# Patient Record
Sex: Female | Born: 1965 | Race: White | Hispanic: No | Smoking: Current every day smoker
Health system: Southern US, Community
[De-identification: ages and names within clinical notes are randomized; demographics above are authoritative.]

## PROBLEM LIST (undated history)

## (undated) DIAGNOSIS — R011 Cardiac murmur, unspecified: Secondary | ICD-10-CM

## (undated) DIAGNOSIS — T7840XA Allergy, unspecified, initial encounter: Secondary | ICD-10-CM

## (undated) HISTORY — PX: AUGMENTATION MAMMAPLASTY: SUR837

## (undated) HISTORY — PX: ABDOMINAL HYSTERECTOMY: SHX81

## (undated) HISTORY — DX: Cardiac murmur, unspecified: R01.1

## (undated) HISTORY — DX: Allergy, unspecified, initial encounter: T78.40XA

---

## 2013-10-19 ENCOUNTER — Ambulatory Visit (INDEPENDENT_AMBULATORY_CARE_PROVIDER_SITE_OTHER): Payer: BC Managed Care – PPO | Admitting: Internal Medicine

## 2013-10-19 VITALS — BP 110/68 | HR 97 | Temp 97.5°F | Resp 16 | Ht 69.0 in | Wt <= 1120 oz

## 2013-10-19 DIAGNOSIS — R300 Dysuria: Secondary | ICD-10-CM

## 2013-10-19 DIAGNOSIS — R350 Frequency of micturition: Secondary | ICD-10-CM

## 2013-10-19 DIAGNOSIS — N390 Urinary tract infection, site not specified: Secondary | ICD-10-CM

## 2013-10-19 LAB — POCT URINALYSIS DIPSTICK
Bilirubin, UA: NEGATIVE
Glucose, UA: NEGATIVE
Ketones, UA: 15
Nitrite, UA: NEGATIVE
PH UA: 5.5
PROTEIN UA: 100
Urobilinogen, UA: 0.2

## 2013-10-19 LAB — POCT UA - MICROSCOPIC ONLY
CRYSTALS, UR, HPF, POC: NEGATIVE
Casts, Ur, LPF, POC: NEGATIVE
Mucus, UA: NEGATIVE
Yeast, UA: NEGATIVE

## 2013-10-19 MED ORDER — PHENAZOPYRIDINE HCL 100 MG PO TABS: 200.0000 mg | ORAL_TABLET | Freq: Three times a day (TID) | ORAL | Status: DC

## 2013-10-19 MED ORDER — PHENAZOPYRIDINE HCL 200 MG PO TABS: 200.0000 mg | ORAL_TABLET | Freq: Three times a day (TID) | ORAL | Status: DC | PRN

## 2013-10-19 MED ORDER — SULFAMETHOXAZOLE-TMP DS 800-160 MG PO TABS: 1.0000 | ORAL_TABLET | Freq: Two times a day (BID) | ORAL | Status: DC

## 2013-10-19 MED ORDER — PHENAZOPYRIDINE HCL 100 MG PO TABS
200.0000 mg | ORAL_TABLET | Freq: Once | ORAL | Status: AC
  Administered 2013-10-19: 200 mg via ORAL

## 2013-10-19 NOTE — Progress Notes (Signed)
Subjective:    Patient ID: Latoya Martinez, female    DOB: 1965-06-25, 48 y.o.   MRN: [id number] This chart was scribed for Ellamae Sia, MD by [place] Peace, ED Scribe. The patient was seen in [facility]. The patient's care was started at 2:23 PM.   HPI HPI Comments: Latoya Martinez is a 48 y.o. female who presents to the [facility] complaining of bladder infection onset yesterday with associated burning upon urination, increased frequency, lower back pain and lower abdominal pain. Pt states she usually gets infections whenever she has to make long drives to other states for work. She further states she just finished a 9 hr drive back from [place]. She states that her PCP is in [place] which is where she lives but she couldn't wait to make it there to get checked so she stopped here in [place]. She reports 1 previous occurrence this past year. Pt also reports that her PCP normally gives her Septra to address bladder infection which she states always provides her with relief. She denies any history of kidney stones.   Review of Systems  Constitutional: Negative for fever and chills.  Gastrointestinal: Positive for abdominal pain.  Genitourinary: Positive for dysuria and frequency.  Musculoskeletal: Positive for back pain.       Objective:   Physical Exam  Nursing note and vitals reviewed. Constitutional: She is oriented to person, place, and time. She appears well-developed and well-nourished. No distress.  HENT:  Head: Normocephalic and atraumatic.  Eyes: Conjunctivae and EOM are normal.  Neck: Neck supple. No tracheal deviation present.  Cardiovascular: Normal rate.   Pulmonary/Chest: Effort normal. No respiratory distress.  Abdominal: There is no tenderness. There is no guarding.  No flank tenderness to percussion  Musculoskeletal: Normal range of motion.  Neurological: She is alert and oriented to person, place, and time.  Skin: Skin is warm and dry.  Psychiatric: She has a  normal mood and affect. Her behavior is normal.   Results for orders placed in visit on 10/19/13  POCT UA - MICROSCOPIC ONLY      Result Value Ref Range   WBC, Ur, HPF, POC 10-12     RBC, urine, microscopic TNTC     Bacteria, U Microscopic 2+     Mucus, UA neg     Epithelial cells, urine per micros 3-5     Crystals, Ur, HPF, POC neg     Casts, Ur, LPF, POC neg     Yeast, UA neg    POCT URINALYSIS DIPSTICK      Result Value Ref Range   Color, UA dark yellow     Clarity, UA cloudy     Glucose, UA neg     Bilirubin, UA neg     Ketones, UA 15     Spec Grav, UA >=1.030     Blood, UA large     pH, UA 5.5     Protein, UA 100     Urobilinogen, UA 0.2     Nitrite, UA neg     Leukocytes, UA small (1+)            Assessment & Plan:  Burning with urination - Plan: POCT UA - Microscopic Only, POCT urinalysis dipstick, phenazopyridine (PYRIDIUM) tablet 200 mg, DISCONTINUED: phenazopyridine (PYRIDIUM) tablet 200 mg  Urination frequency - Plan: POCT UA - Microscopic Only, POCT urinalysis dipstick, phenazopyridine (PYRIDIUM) tablet 200 mg, DISCONTINUED: phenazopyridine (PYRIDIUM) tablet 200 mg  Urinary tract infection, site not specified - Plan: phenazopyridine (PYRIDIUM)  tablet 200 mg, DISCONTINUED: phenazopyridine (PYRIDIUM) tablet 200 mg  Meds ordered this encounter  Medications  . sulfamethoxazole-trimethoprim (BACTRIM DS) 800-160 MG per tablet    Sig: Take 1 tablet by mouth 2 (two) times daily.    Dispense:  10 tablet    Refill:  0  . phenazopyridine (PYRIDIUM) 200 MG tablet    Sig: Take 1 tablet (200 mg total) by mouth 3 (three) times daily as needed for pain.    Dispense:  10 tablet    Refill:  0  . DISCONTD: phenazopyridine (PYRIDIUM) tablet 200 mg    Sig:   . phenazopyridine (PYRIDIUM) tablet 200 mg    Sig:     I have completed the patient encounter in its entirety as documented by the scribe, with editing by me where necessary. Rhen Kawecki P. Merla Riches, M.D.

## 2017-03-28 DIAGNOSIS — B9689 Other specified bacterial agents as the cause of diseases classified elsewhere: Secondary | ICD-10-CM | POA: Diagnosis not present

## 2017-03-28 DIAGNOSIS — R5383 Other fatigue: Secondary | ICD-10-CM | POA: Diagnosis not present

## 2017-03-28 DIAGNOSIS — J019 Acute sinusitis, unspecified: Secondary | ICD-10-CM | POA: Diagnosis not present

## 2017-05-29 DIAGNOSIS — J181 Lobar pneumonia, unspecified organism: Secondary | ICD-10-CM | POA: Diagnosis not present

## 2017-06-15 ENCOUNTER — Ambulatory Visit (INDEPENDENT_AMBULATORY_CARE_PROVIDER_SITE_OTHER): Payer: BLUE CROSS/BLUE SHIELD | Admitting: Family Medicine

## 2017-06-15 ENCOUNTER — Encounter: Payer: Self-pay | Admitting: Family Medicine

## 2017-06-15 VITALS — BP 108/82 | HR 82 | Temp 97.8°F | Wt 137.0 lb

## 2017-06-15 DIAGNOSIS — F909 Attention-deficit hyperactivity disorder, unspecified type: Secondary | ICD-10-CM | POA: Diagnosis not present

## 2017-06-15 DIAGNOSIS — J3089 Other allergic rhinitis: Secondary | ICD-10-CM

## 2017-06-15 DIAGNOSIS — Z7689 Persons encountering health services in other specified circumstances: Secondary | ICD-10-CM | POA: Diagnosis not present

## 2017-06-15 NOTE — Progress Notes (Signed)
Patient presents to clinic today to establish care.  SUBJECTIVE: PMH: Patient is a 52 year old female with past medical history significant for seasonal allergies, COPD, and heart murmur.  Patient was previously seen by Dr. Eustace Quail at [facility] in [place].  Patient states she would like to keep her doctor back at home however finds it inconvenient to have to drive the 3 to 4 hours towards [place] if she needs something.  Patient states [facility]   Seasonal and environmental allergies: -Patient endorses ongoing history of severe allergies -In the past patient has done allergy testing and states she is allergic to multiple things. -Patient states over-the-counter allergy medicine does not help her.  She is currently taking Alavert -Patient inquires about allergy shots. -states in the past was told not to do saline rinse.  ADHD: -Patient has been taking Adderall 10 mg twice daily for 7 years. -Patient states her doctor at home gave her another's month prescription but she will need refills after that. -Patient inquires if this provider can take over that prescription for her so she does not have to drive to [place].  Allergies: NKDA -Patient does endorse sensitivity to steroids.  Patient states they make her anxious/shaky  Past surgical history: Tonsillectomy-1985 Hysterectomy 2000  Social history: Patient is divorced.  She is currently employed as a Immunologist.  Prior to this patient worked for [employer].  Patient has a 2 year old son.  Patient endorses social alcohol use.  Patient endorses occasional tobacco use.  She states she is quit off and on over the years.  Patient is currently smoking 2 cigarettes/day she was recently diagnosed with pneumonia so she stopped smoking during that period of time.  Patient denies drug use.  Family medical history: Mom-alive, arthritis, colon  cancer, hearing loss, stroke Dad-deceased, brain tumor Sister-mildly, alive Sister-Lori, alive Sister-Tula, alive, depression Brother-David, alive, asthma   Health Maintenance: Immunizations --influenza vaccine 2005, TB skin test 2000 Colonoscopy --2007 Mammogram --2007 PAP --2017   No past medical history on file.   Current Outpatient Medications on File Prior to Visit  Medication Sig Dispense Refill  . phenazopyridine (PYRIDIUM) 200 MG tablet Take 1 tablet (200 mg total) by mouth 3 (three) times daily as needed for pain. 10 tablet 0  . sulfamethoxazole-trimethoprim (BACTRIM DS) 800-160 MG per tablet Take 1 tablet by mouth 2 (two) times daily. 10 tablet 0   No current facility-administered medications on file prior to visit.     No Known Allergies  No family history on file.  Social History   Socioeconomic History  . Marital status: Divorced    Spouse name: Not on file  . Number of children: Not on file  . Years of education: Not on file  . Highest education level: Not on file  Occupational History  . Not on file  Social Needs  . Financial resource strain: Not on file  . Food insecurity:    Worry: Not on file    Inability: Not on file  . Transportation needs:    Medical: Not on file    Non-medical: Not on file  Tobacco Use  . Smoking status: Current Every Day Smoker  Substance and Sexual Activity  . Alcohol use: No  . Drug use: No  . Sexual activity: Not on file  Lifestyle  . Physical activity:    Days per week: Not on file    Minutes per session: Not  on file  . Stress: Not on file  Relationships  . Social connections:    Talks on phone: Not on file    Gets together: Not on file    Attends religious service: Not on file    Active member of club or organization: Not on file    Attends meetings of clubs or organizations: Not on file    Relationship status: Not on file  . Intimate partner violence:    Fear of current or ex partner: Not on file     Emotionally abused: Not on file    Physically abused: Not on file    Forced sexual activity: Not on file  Other Topics Concern  . Not on file  Social History Narrative  . Not on file    ROS General: Denies fever, chills, night sweats, changes in weight, changes in appetite  +severe allergies HEENT: Denies headaches, ear pain, changes in vision, rhinorrhea, sore throat CV: Denies CP, palpitations, SOB, orthopnea Pulm: Denies SOB, cough, wheezing GI: Denies abdominal pain, nausea, vomiting, diarrhea, constipation GU: Denies dysuria, hematuria, frequency, vaginal discharge Msk: Denies muscle cramps, joint pains Neuro: Denies weakness, numbness, tingling Skin: Denies rashes, bruising Psych: Denies depression, anxiety, hallucinations  BP 108/82   Pulse 82   Temp 97.8 F (36.6 C)   Wt 137 lb (62.1 kg)   SpO2 96%   BMI 20.23 kg/m   Physical Exam Gen. Pleasant, well developed, well-nourished, in NAD HEENT - [place]/AT, PERRL, no scleral icterus, no nasal drainage, pharynx without erythema or exudate.  TMs full bilaterally.  No cervical lypmhadenopathy Lungs: no use of accessory muscles, CTAB, no wheezes, rales or rhonchi Cardiovascular: RRR, No r/g/m, no peripheral edema Abdomen: BS present, soft, nontender, nondistended Neuro:  A&Ox3, CN II-XII intact, normal gait Skin:  Warm, dry, intact, no lesions Psych: normal affect, mood appropriate  No results found for this or any previous visit (from the past 2160 hour(s)).  Assessment/Plan: Environmental and seasonal allergies -continue alavert  - Plan: Ambulatory referral to Allergy  Attention deficit hyperactivity disorder (ADHD), unspecified ADHD type -continue Adderall 10 mg BID  Encounter to establish care -We reviewed the PMH, PSH, FH, SH, Meds and Allergies. -We provided refills for any medications we will prescribe as needed. -We addressed current concerns per orders and patient instructions. -We have asked for records for  pertinent exams, studies, vaccines and notes from previous providers. -We have advised patient to follow up per instructions below.  F/u prn  Abbe Amsterdam, MD

## 2017-06-15 NOTE — Patient Instructions (Signed)

## 2017-07-17 ENCOUNTER — Other Ambulatory Visit: Payer: Self-pay | Admitting: Family Medicine

## 2017-07-17 NOTE — Telephone Encounter (Signed)
Copied from CRM [id number]. Topic: Quick Communication - Rx Refill/Question >> Jul 17, 2017 10:23 AM Raquel SarnaHayes, Latoya G wrote: amphetamine-dextroamphetamine (ADDERALL) 10 MG tablet  Needing refill  [phone] [name]- [place], [place] - [address] [place] [id number] Phone: [phone] Fax: [phone]

## 2017-07-18 NOTE — Telephone Encounter (Signed)
Pt request refill, please Advice.

## 2017-07-18 NOTE — Telephone Encounter (Signed)
Request for Adderall refill; last refill per historical provider. Last office visit: 06/15/17 PCP: Abbe AmsterdamShannon Banks [facility]acy[facility] [address] [place]

## 2017-07-19 MED ORDER — AMPHETAMINE-DEXTROAMPHETAMINE 10 MG PO TABS: 10.0000 mg | ORAL_TABLET | Freq: Two times a day (BID) | ORAL | 0 refills | Status: DC

## 2017-07-20 ENCOUNTER — Ambulatory Visit: Payer: Self-pay | Admitting: Allergy

## 2017-07-24 ENCOUNTER — Ambulatory Visit (INDEPENDENT_AMBULATORY_CARE_PROVIDER_SITE_OTHER)
Admission: RE | Admit: 2017-07-24 | Discharge: 2017-07-24 | Disposition: A | Discharge status: Home / Self Care | Payer: BLUE CROSS/BLUE SHIELD | Source: Ambulatory Visit | Attending: Family Medicine | Admitting: Family Medicine

## 2017-07-24 ENCOUNTER — Ambulatory Visit (INDEPENDENT_AMBULATORY_CARE_PROVIDER_SITE_OTHER): Payer: BLUE CROSS/BLUE SHIELD | Admitting: Family Medicine

## 2017-07-24 ENCOUNTER — Encounter: Payer: Self-pay | Admitting: Family Medicine

## 2017-07-24 VITALS — BP 124/86 | Temp 98.5°F | Wt 138.0 lb

## 2017-07-24 DIAGNOSIS — M79672 Pain in left foot: Secondary | ICD-10-CM

## 2017-07-24 DIAGNOSIS — M19072 Primary osteoarthritis, left ankle and foot: Secondary | ICD-10-CM | POA: Diagnosis not present

## 2017-07-24 NOTE — Patient Instructions (Signed)
Foot Pain Many things can cause foot pain. Some common causes are:  An injury.  A sprain.  Arthritis.  Blisters.  Bunions.  Follow these instructions at home: Pay attention to any changes in your symptoms. Take these actions to help with your discomfort:  If directed, put ice on the affected area: ? Put ice in a plastic bag. ? Place a towel between your skin and the bag. ? Leave the ice on for 15-20 minutes, 3?4 times a day for 2 days.  Take over-the-counter and prescription medicines only as told by your health care provider.  Wear comfortable, supportive shoes that fit you well. Do not wear high heels.  Do not stand or walk for long periods of time.  Do not lift a lot of weight. This can put added pressure on your feet.  Do stretches to relieve foot pain and stiffness as told by your health care provider.  Rub your foot gently.  Keep your feet clean and dry.  Contact a health care provider if:  Your pain does not get better after a few days of self-care.  Your pain gets worse.  You cannot stand on your foot. Get help right away if:  Your foot is numb or tingling.  Your foot or toes are swollen.  Your foot or toes turn white or blue.  You have warmth and redness along your foot. This information is not intended to replace advice given to you by your health care provider. Make sure you discuss any questions you have with your health care provider. Document Released: 02/27/2015 Document Revised: 07/09/2015 Document Reviewed: 02/26/2014 Elsevier Interactive Patient Education  2018 Elsevier Inc.  

## 2017-07-24 NOTE — Progress Notes (Signed)
Subjective:    Patient ID: Jonathon BellowsJennifer Ashley Nyborg, female    DOB: 06/03/1965, 52 y.o.   MRN: [id number]  No chief complaint on file.   HPI Patient was seen today for acute concern.  Pt endorses left foot pain x10 days.  Pt states she has been walking 3-4 miles several times per week when she noticed the discomfort.  Has been wearing nike tennis shoes.  Initially, pain on dorsum of her left foot but has now moved to the plantar surface with mild edema.  Pt tried ice for her symptoms.  She tried to see the podiatrist but the appt was 1 wk out.  Pt has a h/o flat feet.  Denies h/o gout or DM.  Past Medical History:  Diagnosis Date  . Allergy   . Heart murmur     No Known Allergies  ROS General: Denies fever, chills, night sweats, changes in weight, changes in appetite HEENT: Denies headaches, ear pain, changes in vision, rhinorrhea, sore throat CV: Denies CP, palpitations, SOB, orthopnea Pulm: Denies SOB, cough, wheezing GI: Denies abdominal pain, nausea, vomiting, diarrhea, constipation GU: Denies dysuria, hematuria, frequency, vaginal discharge Msk: Denies muscle cramps, joint pains  +L foot pain Neuro: Denies weakness, numbness, tingling Skin: Denies rashes, bruising Psych: Denies depression, anxiety, hallucinations     Objective:    Blood pressure 124/86, temperature 98.5 F (36.9 C), temperature source Oral, weight 138 lb (62.6 kg).   Gen. Pleasant, well-nourished, in no distress, normal affect   HEENT: [place]/AT, face symmetric, no scleral icterus, PERRLA, nares patent without drainage. Lungs: no accessory muscle use, CTAB, no wheezes or rales Cardiovascular: RRR, no m/r/g, no peripheral edema MSK:  No TTP of b/l feet, DP and PT pulses 2+ b/l. Mild edema of plantar surface of L foot. Normal strength, inversion, eversion, dorsiflexion, and plantar flexion of b/l feet. Neuro:  A&Ox3, CN II-XII intact, normal gait Skin:  Warm, no lesions/ rash   Wt Readings from Last 3  Encounters:  07/24/17 138 lb (62.6 kg)  06/15/17 137 lb (62.1 kg)  10/19/13 14 lb 9.6 oz (6.623 kg)    No results found for: WBC, HGB, HCT, PLT, GLUCOSE, CHOL, TRIG, HDL, LDLDIRECT, LDLCALC, ALT, AST, NA, K, CL, CREATININE, BUN, CO2, TSH, PSA, INR, GLUF, HGBA1C, MICROALBUR  Assessment/Plan:  Left foot pain  -sprain vs stress fx -discussed supportive care, ice, tylenol, elevation - Plan: DG Foot Complete Left -will notify pt of results. May benefit from walking shoe.  F/u prn  Abbe AmsterdamShannon Banks, MD

## 2017-07-25 ENCOUNTER — Telehealth: Payer: Self-pay | Admitting: Family Medicine

## 2017-07-25 NOTE — Telephone Encounter (Signed)
X-ray result were given to pt, verbalized understanding

## 2017-07-25 NOTE — Telephone Encounter (Signed)
Copied from CRM [id number]. Topic: Inquiry >> Jul 25, 2017 10:52 AM Lorrine KinMcGee, Lalena Salas B, NT wrote: Reason for CRM: Patient calling to inquire about her x-ray results. States that it has been a day and she has not heard anything. Is very upset. Explained to her that the results have not been release by Dr Salomon FickBanks yet and that when she had a chance to look over the results, she would release them and have her assistant would give her a call. States that she needs to know today.  CB#: [phone]

## 2017-07-25 NOTE — Telephone Encounter (Signed)
X-Ray results are awaiting review from Dr Salomon FickBanks

## 2017-07-25 NOTE — Telephone Encounter (Signed)
Copied from CRM [id number]. Topic: Inquiry >> Jul 25, 2017  3:02 PM Yvonna Alanisobinson, Andra M wrote: Reason for CRM: Patient called very upset that she has nor received her x-ray results. I advised that patient that the doctor has not had a chance to review the x-ray at this time. The patient states that she must know today. She also stated that she has been waiting for 24 hours. Please call patient today.       Thank You!!!

## 2017-07-26 NOTE — Telephone Encounter (Signed)
Results were given to pt voiced understanding.

## 2017-08-21 ENCOUNTER — Other Ambulatory Visit: Payer: Self-pay | Admitting: Family Medicine

## 2017-08-21 NOTE — Telephone Encounter (Signed)
Copied from CRM [id number]. Topic: Quick Communication - Rx Refill/Question >> Aug 21, 2017 12:29 PM Mcneil, Ja-Kwan wrote: Medication: amphetamine-dextroamphetamine (ADDERALL) 10 MG tablet  Has the patient contacted their pharmacy?  no  Preferred Pharmacy (with phone number or street name): [id number] [place] [place] - [address] [phone] (Phone) [phone] (Fax)    Agent: Please be advised that RX refills may take up to 3 business days. We ask that you follow-up with your pharmacy.

## 2017-08-22 NOTE — Telephone Encounter (Signed)
Rx refill request: Adderall 10 mg     Last filled: 07/19/17  LOV: 06/15/17   PCP: Salomon FickBanks  Pharmacy: verified

## 2017-08-22 NOTE — Telephone Encounter (Signed)
Please Advise

## 2017-08-24 MED ORDER — AMPHETAMINE-DEXTROAMPHETAMINE 10 MG PO TABS: 10.0000 mg | ORAL_TABLET | Freq: Two times a day (BID) | ORAL | 0 refills | Status: DC

## 2017-08-24 NOTE — Telephone Encounter (Signed)
Patient requesting rx to be sent electronically. She does want to have to drive to office to pick up hard copy rx. States last time it was filled electronically. Please advise.

## 2017-08-24 NOTE — Telephone Encounter (Signed)
Called pt left a message for pt to return my call in the office regarding her Rx for Adderall, Rx is ready for pick up at the front office.

## 2017-08-25 NOTE — Telephone Encounter (Signed)
Electronic system is not working when this provider tried to escribe this med this wk.

## 2017-08-28 NOTE — Telephone Encounter (Signed)
Left message notifying patient that hard copy rx will need to be picked up in office.

## 2017-08-30 ENCOUNTER — Ambulatory Visit: Payer: Self-pay | Admitting: Allergy

## 2017-09-15 ENCOUNTER — Encounter: Payer: Self-pay | Admitting: Family Medicine

## 2017-09-15 ENCOUNTER — Ambulatory Visit (INDEPENDENT_AMBULATORY_CARE_PROVIDER_SITE_OTHER): Payer: BLUE CROSS/BLUE SHIELD | Admitting: Family Medicine

## 2017-09-15 VITALS — BP 118/60 | HR 86 | Temp 97.6°F | Wt 136.8 lb

## 2017-09-15 DIAGNOSIS — J029 Acute pharyngitis, unspecified: Secondary | ICD-10-CM

## 2017-09-15 MED ORDER — AMOXICILLIN 500 MG PO CAPS: 1000.0000 mg | ORAL_CAPSULE | Freq: Every day | ORAL | 0 refills | Status: AC

## 2017-09-15 NOTE — Patient Instructions (Signed)
Consider using nasal saline rinses 30-60 minutes prior to bedtime to help decrease post nasal drainage once you lay down. The neti pot is very effective for this.

## 2017-09-15 NOTE — Progress Notes (Signed)
Thamas JaegersJennifer Ashley Silverman DOB: 03/22/1965 Encounter date: 09/15/2017  This is a 52 y.o. female who presents with Chief Complaint  Patient presents with  . Sore Throat    traveling monday, sore throat x 3 days, hurts to swallow, chills last weekend, no energy, patient states she has bad seasonal allergies and has not been taking her allergy medication    History of present illness:  Has had a lot of sick exposures at work. When she woke up this morning felt like knives in right side of throat. Throat felt worse this morning than it did at beginning; seems to switch sides. Worst in morning/night. Not sure if she is getting post nasal drainage. No cough. No fevers, chills. Some fatigue. No sinus pain/pressure. Right ear feels a little different.       No Known Allergies Current Meds  Medication Sig  . amphetamine-dextroamphetamine (ADDERALL) 10 MG tablet Take 1 tablet (10 mg total) by mouth 2 (two) times daily with a meal.  . [DISCONTINUED] phenazopyridine (PYRIDIUM) 200 MG tablet Take 1 tablet (200 mg total) by mouth 3 (three) times daily as needed for pain.    Review of Systems  Constitutional: Positive for fatigue. Negative for chills and fever.  HENT: Positive for sore throat. Negative for congestion, ear pain, hearing loss, sinus pressure and sinus pain.   Respiratory: Negative for cough, chest tightness, shortness of breath and wheezing.   Cardiovascular: Negative for chest pain.  Gastrointestinal: Positive for abdominal pain (last week for 2-3 days had some stomach discomfort; right lower abd. nonspecific. Just decreased appetite slightly.). Negative for constipation, diarrhea, nausea and vomiting.  Skin: Negative for rash.    Objective:  BP 118/60 (BP Location: Left Arm, Patient Position: Sitting, Cuff Size: Normal)   Pulse 86   Temp 97.6 F (36.4 C) (Oral)   Wt 136 lb 12.8 oz (62.1 kg)   SpO2 97%   BMI 20.20 kg/m   Weight: 136 lb 12.8 oz (62.1 kg)   BP Readings from Last  3 Encounters:  09/15/17 118/60  07/24/17 124/86  06/15/17 108/82   Wt Readings from Last 3 Encounters:  09/15/17 136 lb 12.8 oz (62.1 kg)  07/24/17 138 lb (62.6 kg)  06/15/17 137 lb (62.1 kg)    Physical Exam  Constitutional: She appears well-developed and well-nourished. No distress.  HENT:  Right Ear: Tympanic membrane and ear canal normal.  Left Ear: Tympanic membrane and ear canal normal.  Nose: Right sinus exhibits no maxillary sinus tenderness and no frontal sinus tenderness. Left sinus exhibits no maxillary sinus tenderness and no frontal sinus tenderness.  Mouth/Throat: Mucous membranes are normal. No oral lesions. No uvula swelling. Posterior oropharyngeal edema (more towards right) and posterior oropharyngeal erythema present. No oropharyngeal exudate.  Cardiovascular: Normal rate, regular rhythm and normal heart sounds. Exam reveals no friction rub.  No murmur heard. Pulmonary/Chest: Effort normal and breath sounds normal. No respiratory distress. She has no wheezes. She has no rales.  Lymphadenopathy:    She has cervical adenopathy (anterior cervical).  Psychiatric: She has a normal mood and affect.    Assessment/Plan 1. Sore throat Strep positive in the office.  Amoxicillin given to cover for strep.  Let us know if not feeling significantly improved in 48 hours.  Let us know if any worsening.  We did discuss importance of completing antibiotic course. We did discuss contagiousness of strep.  - POC Rapid Strep A  Return if symptoms worsen or fail to improve.  Micheline Rough, MD

## 2017-09-18 LAB — POCT RAPID STREP A (OFFICE): Rapid Strep A Screen: POSITIVE — AB

## 2017-09-22 ENCOUNTER — Other Ambulatory Visit: Payer: Self-pay | Admitting: Family Medicine

## 2017-09-22 ENCOUNTER — Telehealth: Payer: Self-pay | Admitting: Family Medicine

## 2017-09-22 DIAGNOSIS — F909 Attention-deficit hyperactivity disorder, unspecified type: Secondary | ICD-10-CM

## 2017-09-22 MED ORDER — AMPHETAMINE-DEXTROAMPHETAMINE 10 MG PO TABS: 10.0000 mg | ORAL_TABLET | Freq: Two times a day (BID) | ORAL | 0 refills | Status: DC

## 2017-09-22 NOTE — Telephone Encounter (Signed)
Copied from CRM [id number]. Topic: General - Other >> Sep 22, 2017  9:51 AM Leafy Roobinson, Norma J wrote: Reason for CRM:pt is calling and needs new rx adderall 10 mg. [facility] [address]

## 2017-09-22 NOTE — Telephone Encounter (Signed)
Pt was last seen on 07/24/2017 and last refill was done on 08/24/2017 for 60 tablets with no refills, please Advise

## 2017-09-22 NOTE — Telephone Encounter (Signed)
Refilled adderall.

## 2017-09-25 NOTE — Telephone Encounter (Signed)
Called pt aware that Rx was sent to her pharmacy as requested.

## 2017-09-28 ENCOUNTER — Ambulatory Visit: Payer: BLUE CROSS/BLUE SHIELD | Admitting: Family Medicine

## 2017-09-28 ENCOUNTER — Telehealth: Payer: Self-pay | Admitting: Family Medicine

## 2017-09-28 ENCOUNTER — Other Ambulatory Visit: Payer: Self-pay | Admitting: Family Medicine

## 2017-09-28 MED ORDER — AZITHROMYCIN 250 MG PO TABS: ORAL_TABLET | ORAL | 0 refills | Status: DC

## 2017-09-28 NOTE — Telephone Encounter (Signed)
Did symptoms completely go away during time of antibiotic? If so; I would recommend retesting in office before treating with another antibiotic.   If symptoms did not completely resolve with antibiotic I will send in something different.

## 2017-09-28 NOTE — Telephone Encounter (Signed)
Called patient back. She advised me that she has already spoken to someone from the office and made  An appointment.

## 2017-09-28 NOTE — Telephone Encounter (Signed)
Will send to Dr. Hassan RowanKoberlein for advise.

## 2017-09-28 NOTE — Telephone Encounter (Signed)
Spoke to Dr. Hassan RowanKoberlein. Dr. Hassan RowanKoberlein called pt back and and advised that we would send in a new antibiotic and if the pt symptoms continue she should make an appointment Monday. Rx has been sent.  Patient app canceled per Dr. Hassan RowanKoberlein.

## 2017-09-28 NOTE — Telephone Encounter (Signed)
Copied from CRM [id number]. Topic: Quick Communication - See Telephone Encounter >> Sep 28, 2017  9:00 AM Trula SladeWalter, Linda F wrote: CRM for notification. See Telephone encounter for: 09/28/17. Patient was in on 09/15/17 for Strep Throat and she finished the medication 10 days ago and a couple of days ago the same symptoms started up again.  Please advise. If something is to be called into the pharmacy, please send [facility] [address]

## 2017-09-28 NOTE — Progress Notes (Signed)
I called patient after talking with MA who had checked in with her. Patient states that she started to feel a little better after starting amoxicillin but then got worse towards end of antibiotic. Symptoms started coming back and had more pain in back of throat on right side.   I have sent in zithromax for her. Since she did not improve with previous treatment I do not feel we need to retest at this point. If not improved on Monday will need evaluation in office. She agrees with plan.

## 2017-10-02 ENCOUNTER — Ambulatory Visit (INDEPENDENT_AMBULATORY_CARE_PROVIDER_SITE_OTHER): Payer: BLUE CROSS/BLUE SHIELD | Admitting: Family Medicine

## 2017-10-02 ENCOUNTER — Encounter: Payer: Self-pay | Admitting: Family Medicine

## 2017-10-02 VITALS — BP 120/78 | HR 99 | Temp 98.9°F | Resp 12 | Ht 69.0 in | Wt 138.1 lb

## 2017-10-02 DIAGNOSIS — R5383 Other fatigue: Secondary | ICD-10-CM

## 2017-10-02 DIAGNOSIS — R0982 Postnasal drip: Secondary | ICD-10-CM | POA: Diagnosis not present

## 2017-10-02 DIAGNOSIS — J029 Acute pharyngitis, unspecified: Secondary | ICD-10-CM | POA: Diagnosis not present

## 2017-10-02 NOTE — Patient Instructions (Addendum)
  Ms.Terrance Ascencion DikeAshley Wulff I have seen you today for an acute visit.  A few things to remember from today's visit:   Sore throat - Plan: POC Rapid Strep A  Other fatigue    Symptomatic treatment: Over the counter Acetaminophen 500 mg and/or Ibuprofen (400-600 mg) if there is not contraindications; you can alternate in between both every 4-6 hours. Gargles with saline water and throat lozenges might also help. Cold fluids.    Seek prompt medical evaluation if you are having difficulty breathing, mouth swelling, throat closing up, not able to swallow liquids (drooling), skin rash/bruising, or worsening symptoms.  Please follow up in 2 weeks if not any better.   In general please monitor for signs of worsening symptoms and seek immediate medical attention if any concerning.    I hope you get better soon!

## 2017-10-02 NOTE — Progress Notes (Signed)
ACUTE VISIT   HPI:  Chief Complaint  Patient presents with  . Sore Throat    dx with strep 2 weeks ago donot think it is gone away    [name] is a 52 y.o. female, who is here today complaining of persistent postnasal drainage, sore throat, fatigue, and chills.  Diagnosed with strep pharyngitis on 09/18/2017, she was treated with 10 days of amoxicillin. Because she was still symptomatic azithromycin was started, today last day. She wants to be tested for strep again.  She is concerned because she started with productive cough with whitish sputum a couple days ago. She denies dyspnea, wheezing. Cough causes chest pain. If fullness sensation, no ear ache.  She has some diarrhea, she is taking probiotics. No abdominal pain, nausea, vomiting.  She feels "exhausted."   She has history of "massive allergies", no history of asthma. + Smoker.   Review of Systems  Constitutional: Positive for chills and fatigue. Negative for fever.  HENT: Positive for postnasal drip and sore throat. Negative for congestion, ear discharge, ear pain, facial swelling, mouth sores, nosebleeds and sinus pain.   Respiratory: Positive for cough. Negative for shortness of breath and wheezing.   Cardiovascular: Negative for chest pain and leg swelling.  Gastrointestinal: Negative for abdominal pain, diarrhea, nausea and vomiting.  Musculoskeletal: Negative for joint swelling and myalgias.  Skin: Negative for rash.  Allergic/Immunologic: Positive for environmental allergies.  Neurological: Negative for weakness and headaches.  Hematological: Positive for adenopathy. Does not bruise/bleed easily.  Psychiatric/Behavioral: Negative for confusion. The patient is nervous/anxious.       Current Outpatient Medications on File Prior to Visit  Medication Sig Dispense Refill  . amphetamine-dextroamphetamine (ADDERALL) 10 MG tablet Take 1 tablet (10 mg total) by mouth 2 (two) times daily  with a meal. 60 tablet 0  . amphetamine-dextroamphetamine (ADDERALL) 10 MG tablet Take 1 tablet (10 mg total) by mouth 2 (two) times daily. 60 tablet 0  . amphetamine-dextroamphetamine (ADDERALL) 10 MG tablet Take 1 tablet (10 mg total) by mouth 2 (two) times daily. 60 tablet 0  . azithromycin (ZITHROMAX) 250 MG tablet Take 2 tabs PO day 1 then 1 tab daily PO days 2-5 6 tablet 0   No current facility-administered medications on file prior to visit.      Past Medical History:  Diagnosis Date  . Allergy   . Heart murmur    No Known Allergies  Social History   Socioeconomic History  . Marital status: Divorced    Spouse name: Not on file  . Number of children: Not on file  . Years of education: Not on file  . Highest education level: Not on file  Occupational History  . Not on file  Social Needs  . Financial resource strain: Not on file  . Food insecurity:    Worry: Not on file    Inability: Not on file  . Transportation needs:    Medical: Not on file    Non-medical: Not on file  Tobacco Use  . Smoking status: Current Every Day Smoker  . Smokeless tobacco: Current User  Substance and Sexual Activity  . Alcohol use: No  . Drug use: No  . Sexual activity: Not on file  Lifestyle  . Physical activity:    Days per week: Not on file    Minutes per session: Not on file  . Stress: Not on file  Relationships  . Social connections:    Talks on  phone: Not on file    Gets together: Not on file    Attends religious service: Not on file    Active member of club or organization: Not on file    Attends meetings of clubs or organizations: Not on file    Relationship status: Not on file  Other Topics Concern  . Not on file  Social History Narrative  . Not on file    Vitals:   10/02/17 1507  BP: 120/78  Pulse: 99  Resp: 12  Temp: 98.9 F (37.2 C)  SpO2: 97%   Body mass index is 20.4 kg/m.   Physical Exam  Nursing note and vitals reviewed. Constitutional: She is  oriented to person, place, and time. She appears well-developed and well-nourished. She does not appear ill. No distress.  HENT:  Head: Normocephalic and atraumatic.  Right Ear: Tympanic membrane, external ear and ear canal normal.  Left Ear: Tympanic membrane, external ear and ear canal normal.  Mouth/Throat: Oropharynx is clear and moist and mucous membranes are normal.  Minimal post nasal drainage. Hyperemic nasal mucosa. Mild dysphonia, reported as her "normal" voice.  Eyes: Conjunctivae are normal.  Neck: No muscular tenderness present. No edema and no erythema present.  Cardiovascular: Normal rate and regular rhythm.  No murmur heard. Respiratory: Effort normal and breath sounds normal. No stridor. No respiratory distress.  Lymphadenopathy:       Head (right side): No submandibular adenopathy present.       Head (left side): No submandibular adenopathy present.    She has no cervical adenopathy.  Neurological: She is alert and oriented to person, place, and time. She has normal strength. Gait normal.  Skin: Skin is warm. No rash noted. No erythema.  Psychiatric: Her speech is normal. Her mood appears anxious.  Well groomed, good eye contact.      ASSESSMENT AND PLAN:   [name] was seen today for sore throat.  Diagnoses and all orders for this visit:  Sore throat  Clinical findings do not suggest strep pharyngitis at this time. We discussed other possible etiologies. I do not appreciate adenopathy is on examination today.  Rapid strep done here in the office negative, I do not think throat culture is necessary at this time given the fact she has completed 15 days of antibiotic + clinical findings. Recommend OTC throat lozenges. Gargle with saline. Instructed about warning signs.  -     POC Rapid Strep A  Other fatigue  Reported as new problem.  Explained that it is not uncommon to have residual fatigue after respiratory infection, mainly after viral  illnesses. I do not think further work-up is needed today. Recommend following with PCP if she does not feel better in 1 to 2 weeks, before if she feels worse or new symptom presents.  Post-nasal drainage  She is very concerned about postnasal drainage, she is asking why she is having it. On examination postnasal drainage she is minimal. Explained that postnasal drainage and cough can last a few weeks after URI. Adequate hydration recommended.   Return if symptoms worsen or fail to improve.     Latif Nazareno G. [place], MD  [facility]. [place] [facility].

## 2017-10-03 LAB — POCT RAPID STREP A (OFFICE): Rapid Strep A Screen: NEGATIVE

## 2017-10-25 ENCOUNTER — Telehealth: Payer: Self-pay | Admitting: Family Medicine

## 2017-10-25 NOTE — Telephone Encounter (Signed)
Copied from CRM [id number]. Topic: Quick Communication - Rx Refill/Question >> Oct 25, 2017 11:01 AM Gean Birchwood R wrote: Medication: amphetamine-dextroamphetamine (ADDERALL) 10 MG tablet  Has the patient contacted their pharmacy? Yes  Preferred Pharmacy (with phone number or street name): [id number] [place], [place] - [address] [phone] (Phone) [phone] (Fax)   Agent: Please be advised that RX refills may take up to 3 business days. We ask that you follow-up with your pharmacy.

## 2017-10-26 NOTE — Telephone Encounter (Signed)
Spoke with pt voiced understanding that she has more refills for her Rx adderall at her pharmacy

## 2017-11-20 ENCOUNTER — Telehealth: Payer: Self-pay | Admitting: Family Medicine

## 2017-11-20 NOTE — Telephone Encounter (Signed)
Called [facility] [address] and pt has a refill that can be filled on or after 11/24/17. Called pt and made her aware that the prescription is on file and she will need to call pharmacy Thursday so she can pick it up Friday. Pt stated understanding.

## 2017-11-20 NOTE — Telephone Encounter (Unsigned)
Copied from CRM [id number]. Topic: Quick Communication - See Telephone Encounter >> Nov 20, 2017  3:16 PM Waymon Amato wrote: Pt is needing a refill on adderall it is not due until Friday   [facility] [address]  Best number [phone]

## 2017-12-05 DIAGNOSIS — L0291 Cutaneous abscess, unspecified: Secondary | ICD-10-CM | POA: Diagnosis not present

## 2017-12-05 DIAGNOSIS — Z23 Encounter for immunization: Secondary | ICD-10-CM | POA: Diagnosis not present

## 2017-12-22 ENCOUNTER — Other Ambulatory Visit: Payer: Self-pay | Admitting: Family Medicine

## 2017-12-22 DIAGNOSIS — F909 Attention-deficit hyperactivity disorder, unspecified type: Secondary | ICD-10-CM

## 2017-12-22 NOTE — Telephone Encounter (Signed)
Requested medication (s) are due for refill today: yes  Requested medication (s) are on the active medication list: yes  Last refill:  09/22/17  Future visit scheduled: no  Notes to clinic:  Controlled substance   Requested Prescriptions  Pending Prescriptions Disp Refills   amphetamine-dextroamphetamine (ADDERALL) 10 MG tablet 60 tablet 0    Sig: Take 1 tablet (10 mg total) by mouth 2 (two) times daily with a meal.     Not Delegated - Psychiatry:  Stimulants/ADHD Failed - 12/22/2017  1:14 PM      Failed - This refill cannot be delegated      Failed - Urine Drug Screen completed in last 360 days.      Failed - Valid encounter within last 3 months    Recent Outpatient Visits          2 months ago Sore throat   [facility] [place], Timoteo Expose, MD   3 months ago Sore throat   [facility], Paris Lore, MD   5 months ago Left foot pain   [facility], Bettey Mare, MD   6 months ago Environmental and seasonal allergies   [facility], Bettey Mare, MD   4 years ago Burning with urination   [facility] at [facility], Harrel Lemon, MD

## 2017-12-22 NOTE — Telephone Encounter (Signed)
Copied from CRM [id number]. Topic: Quick Communication - Rx Refill/Question >> Dec 22, 2017  1:10 PM Chilton Si N wrote: Medication: amphetamine-dextroamphetamine (ADDERALL) 10 MG tablet  Patient is requesting a refill of this medication.   Preferred Pharmacy (with phone number or street name):[phone] [name],  - [address]  [phone] (Phone) [phone] (Fax)

## 2017-12-25 MED ORDER — AMPHETAMINE-DEXTROAMPHETAMINE 10 MG PO TABS: 10.0000 mg | ORAL_TABLET | Freq: Two times a day (BID) | ORAL | 0 refills | Status: DC

## 2017-12-25 NOTE — Telephone Encounter (Signed)
Please Advise

## 2018-01-23 ENCOUNTER — Other Ambulatory Visit: Payer: Self-pay | Admitting: Family Medicine

## 2018-01-23 DIAGNOSIS — F909 Attention-deficit hyperactivity disorder, unspecified type: Secondary | ICD-10-CM

## 2018-01-23 NOTE — Telephone Encounter (Signed)
See original CRM

## 2018-01-23 NOTE — Telephone Encounter (Signed)
Copied from CRM [id number]. Topic: Quick Communication - Rx Refill/Question >> Jan 23, 2018  9:40 AM Percival SpanishKennedy, Cheryl W wrote: Medication: ***  Has the patient contacted their pharmacy?  (Agent: If no, request that the patient contact the pharmacy for the refill.) (Agent: If yes, when and what did the pharmacy advise?)  Preferred Pharmacy (with phone number or street name): ***  Agent: Please be advised that RX refills may take up to 3 business days. We ask that you follow-up with your pharmacy.

## 2018-01-25 MED ORDER — AMPHETAMINE-DEXTROAMPHETAMINE 10 MG PO TABS: 10.0000 mg | ORAL_TABLET | Freq: Two times a day (BID) | ORAL | 0 refills | Status: DC

## 2018-01-25 NOTE — Telephone Encounter (Signed)
Pt LOV was on 10/02/2017 and last refill was done on 12/25/2017 please Advise

## 2018-02-22 ENCOUNTER — Other Ambulatory Visit: Payer: Self-pay | Admitting: Family Medicine

## 2018-02-22 DIAGNOSIS — F909 Attention-deficit hyperactivity disorder, unspecified type: Secondary | ICD-10-CM

## 2018-02-22 NOTE — Telephone Encounter (Signed)
Requested medication (s) are due for refill today: yes  Requested medication (s) are on the active medication list: yes    Last refill: 01/25/18  #60  0 refills  Future visit scheduled no  Notes to clinic:not delegated  Requested Prescriptions  Pending Prescriptions Disp Refills   amphetamine-dextroamphetamine (ADDERALL) 10 MG tablet 60 tablet 0    Sig: Take 1 tablet (10 mg total) by mouth 2 (two) times daily with a meal.     Not Delegated - Psychiatry:  Stimulants/ADHD Failed - 02/22/2018  2:58 PM      Failed - This refill cannot be delegated      Failed - Urine Drug Screen completed in last 360 days.      Failed - Valid encounter within last 3 months    Recent Outpatient Visits          4 months ago Sore throat   [facility], Timoteo Expose, MD   5 months ago Sore throat   [facility], Paris Lore, MD   7 months ago Left foot pain   [facility], Bettey Mare, MD   8 months ago Environmental and seasonal allergies   [facility], Bettey Mare, MD   4 years ago Burning with urination   [facility] at [facility], Harrel Lemon, MD

## 2018-02-22 NOTE — Telephone Encounter (Signed)
Copied from CRM [id number]. Topic: Quick Communication - Rx Refill/Question >> Feb 22, 2018  2:54 PM Arlyss Gandy, NT wrote: Medication: amphetamine-dextroamphetamine (ADDERALL) 10 MG tablet  Has the patient contacted their pharmacy? Yes.   (Agent: If no, request that the patient contact the pharmacy for the refill.) (Agent: If yes, when and what did the pharmacy advise?)  Preferred Pharmacy (with phone number or street name): [id number] [place], [place] - [address] [phone] (Phone) [phone] (Fax)    Agent: Please be advised that RX refills may take up to 3 business days. We ask that you follow-up with your pharmacy.

## 2018-02-22 NOTE — Telephone Encounter (Signed)
Last OV was 819/2019 and last refill was 01/25/2018 for 60 tab, please advise

## 2018-02-26 NOTE — Telephone Encounter (Signed)
Pt calling to check. Would like sent in as soon as possible

## 2018-02-27 MED ORDER — AMPHETAMINE-DEXTROAMPHETAMINE 10 MG PO TABS: 10.0000 mg | ORAL_TABLET | Freq: Two times a day (BID) | ORAL | 0 refills | Status: DC

## 2018-02-27 NOTE — Telephone Encounter (Signed)
Pt has to be seen every 3 months for adderall.

## 2018-03-27 ENCOUNTER — Telehealth: Payer: Self-pay | Admitting: Family Medicine

## 2018-03-27 NOTE — Telephone Encounter (Signed)
Copied from CRM [id number]. Topic: Quick Communication - Rx Refill/Question >> Mar 27, 2018  1:34 PM Latoya Martinez wrote: Medication: amphetamine-dextroamphetamine (ADDERALL) 10 MG tablet  Has the patient contacted their pharmacy? no  Preferred Pharmacy (with phone number or [facility] name):[phone] [name], [place] - [address] [phone] (Phone) [phone] (Fax)     Agent: Please be advised that RX refills may take up to 3 business days. We ask that you follow-up with your pharmacy.

## 2018-03-30 NOTE — Telephone Encounter (Signed)
Pt has to be seen regularly for f/u on ADHD.  Given a 1 month supply last month and advised to f/u.

## 2018-03-30 NOTE — Telephone Encounter (Signed)
Pt calling and is requesting that the Rx be sent in today as soon as possible.  Thanks.

## 2018-03-30 NOTE — Telephone Encounter (Signed)
Pt LOV was on 10/02/2017 and last refill was done on 02/27/2018 for 60 tablets, please advise

## 2018-04-02 NOTE — Telephone Encounter (Signed)
Patient has made an appt for 2/28 and would like to know can this be called in

## 2018-04-02 NOTE — Telephone Encounter (Signed)
Please advise 

## 2018-04-03 NOTE — Telephone Encounter (Signed)
° ° °  Pt call and ask if this med is gong to be refilled since she has made an appt would like a call back

## 2018-04-04 NOTE — Telephone Encounter (Signed)
Please advise 

## 2018-04-04 NOTE — Telephone Encounter (Signed)
Pt called to check status of refill. Two previous messages have advised she has scheduled OV as requested. She is now out of medication. No refill has been sent in yet. She is asking for refill to be sent in ASAP please. Pt advised message would be sent to clinic asking for rx to be sent in today and that she must keep appt for further refills.

## 2018-04-05 ENCOUNTER — Other Ambulatory Visit: Payer: Self-pay | Admitting: Family Medicine

## 2018-04-05 MED ORDER — AMPHETAMINE-DEXTROAMPHETAMINE 10 MG PO TABS
10.0000 mg | ORAL_TABLET | Freq: Two times a day (BID) | ORAL | 0 refills | Status: DC
Start: 2018-04-05 — End: 2020-04-16

## 2018-04-05 NOTE — Progress Notes (Signed)
Pt requesting refill of Adderall 10 mg BID.  Pt last seen for ADHD on 06/15/17.  Pt has been advised multiple times since that visit to make a f/u appointment for ADHD medication refill.  Pt has an appt on 04/13/18.  Will send in a limited 1 wk supply of meds until this appt.  Moving forward if pt does not f/u regularly (q 3 months) no refills will be given.

## 2018-04-13 ENCOUNTER — Ambulatory Visit (INDEPENDENT_AMBULATORY_CARE_PROVIDER_SITE_OTHER): Payer: BLUE CROSS/BLUE SHIELD | Admitting: Family Medicine

## 2018-04-13 ENCOUNTER — Encounter: Payer: Self-pay | Admitting: Family Medicine

## 2018-04-13 VITALS — BP 120/74 | HR 86 | Wt 136.0 lb

## 2018-04-13 DIAGNOSIS — F909 Attention-deficit hyperactivity disorder, unspecified type: Secondary | ICD-10-CM

## 2018-04-13 DIAGNOSIS — F419 Anxiety disorder, unspecified: Secondary | ICD-10-CM | POA: Diagnosis not present

## 2018-04-13 MED ORDER — AMPHETAMINE-DEXTROAMPHETAMINE 10 MG PO TABS: 10.0000 mg | ORAL_TABLET | Freq: Two times a day (BID) | ORAL | 0 refills | Status: DC

## 2018-04-13 MED ORDER — HYDROXYZINE HCL 25 MG PO TABS: 25.0000 mg | ORAL_TABLET | Freq: Three times a day (TID) | ORAL | 0 refills | Status: DC | PRN

## 2018-04-13 MED ORDER — AMPHETAMINE-DEXTROAMPHETAMINE 10 MG PO TABS
10.0000 mg | ORAL_TABLET | Freq: Two times a day (BID) | ORAL | 0 refills | Status: DC
Start: 2018-04-13 — End: 2018-07-22

## 2018-04-13 NOTE — Progress Notes (Signed)
Subjective:    Patient ID: Latoya Martinez, female    DOB: August 23, 1965, 53 y.o.   MRN: [id number]  No chief complaint on file.   HPI Patient was seen today for follow-up.  Patient following up on ADHD.  Patient currently taking Adderall 10 mg twice daily.  Pt requesting refills.  Pt denies difficulty with sleep, weight.    Pt does endorse increased stress at work d/t her boss.  Pt states a coworker quit as a result of the changes at work.  Pt notes her boss is "doing some shady things".  Pt notes decreased appetite, increased fidgeting, and panic attacks due to the increased stress.  Pt notes she has a wk long work trip in [place] coming up.  Pt states her boyfriend has even noticed her increased anxiety.  Pt requesting medication.  Past Medical History:  Diagnosis Date  . Allergy   . Heart murmur     No Known Allergies  ROS General: Denies fever, chills, night sweats, changes in weight, changes in appetite HEENT: Denies headaches, ear pain, changes in vision, rhinorrhea, sore throat CV: Denies CP, palpitations, SOB, orthopnea Pulm: Denies SOB, cough, wheezing GI: Denies abdominal pain, nausea, vomiting, diarrhea, constipation GU: Denies dysuria, hematuria, frequency, vaginal discharge Msk: Denies muscle cramps, joint pains Neuro: Denies weakness, numbness, tingling Skin: Denies rashes, bruising Psych: Denies depression, hallucinations +anxiety, inattention, hyperactivity      Objective:    Blood pressure 120/74, pulse 86, weight 136 lb (61.7 kg), SpO2 96 %.  Gen. Pleasant, well-nourished, in no distress, normal affect   HEENT: [place]/AT, face symmetric, no scleral icterus, PERRLA, nares patent without drainage Lungs: no accessory muscle use, CTAB, no wheezes or rales Cardiovascular: RRR, no m/r/g, no peripheral edema Neuro:  A&Ox3, CN II-XII intact, normal gait Skin:  Warm, no lesions/ rash  Wt Readings from Last 3 Encounters:  04/13/18 136 lb (61.7 kg)  10/02/17 138 lb 2  oz (62.7 kg)  09/15/17 136 lb 12.8 oz (62.1 kg)    No results found for: WBC, HGB, HCT, PLT, GLUCOSE, CHOL, TRIG, HDL, LDLDIRECT, LDLCALC, ALT, AST, NA, K, CL, CREATININE, BUN, CO2, TSH, PSA, INR, GLUF, HGBA1C, MICROALBUR  Assessment/Plan:  Attention deficit hyperactivity disorder (ADHD), unspecified ADHD type  -weight stable.  2 lb weight loss since 09/2017.  We will continue to monitor - Plan: amphetamine-dextroamphetamine (ADDERALL) 10 MG tablet, amphetamine-dextroamphetamine (ADDERALL) 10 MG tablet, amphetamine-dextroamphetamine (ADDERALL) 10 MG tablet -Patient advised of required follow-up every 3 to 4 months in order to have Adderall refilled.  Patient expressed understanding  Anxiety  -PHQ 9 score 11 -Gad 7 score 13 -discussed ways to reduce stress -given handout -Consider counseling - Plan: hydrOXYzine (ATARAX/VISTARIL) 25 MG tablet -Follow-up in 1 month  Abbe Amsterdam, MD

## 2018-04-13 NOTE — Patient Instructions (Signed)
Living With Anxiety    After being diagnosed with an anxiety disorder, you may be relieved to know why you have felt or behaved a certain way. It is natural to also feel overwhelmed about the treatment ahead and what it will mean for your life. With care and support, you can manage this condition and recover from it.  How to cope with anxiety  Dealing with stress  Stress is your body’s reaction to life changes and events, both good and bad. Stress can last just a few hours or it can be ongoing. Stress can play a major role in anxiety, so it is important to learn both how to cope with stress and how to think about it differently.  Talk with your health care provider or a counselor to learn more about stress reduction. He or she may suggest some stress reduction techniques, such as:  · Music therapy. This can include creating or listening to music that you enjoy and that inspires you.  · Mindfulness-based meditation. This involves being aware of your normal breaths, rather than trying to control your breathing. It can be done while sitting or walking.  · Centering prayer. This is a kind of meditation that involves focusing on a word, phrase, or sacred image that is meaningful to you and that brings you peace.  · Deep breathing. To do this, expand your stomach and inhale slowly through your nose. Hold your breath for 3-5 seconds. Then exhale slowly, allowing your stomach muscles to relax.  · Self-talk. This is a skill where you identify thought patterns that lead to anxiety reactions and correct those thoughts.  · Muscle relaxation. This involves tensing muscles then relaxing them.  Choose a stress reduction technique that fits your lifestyle and personality. Stress reduction techniques take time and practice. Set aside 5-15 minutes a day to do them. Therapists can offer training in these techniques. The training may be covered by some insurance plans. Other things you can do to manage stress include:  · Keeping a  stress diary. This can help you learn what triggers your stress and ways to control your response.  · Thinking about how you respond to certain situations. You may not be able to control everything, but you can control your reaction.  · Making time for activities that help you relax, and not feeling guilty about spending your time in this way.  Therapy combined with coping and stress-reduction skills provides the best chance for successful treatment.  Medicines  Medicines can help ease symptoms. Medicines for anxiety include:  · Anti-anxiety drugs.  · Antidepressants.  · Beta-blockers.  Medicines may be used as the main treatment for anxiety disorder, along with therapy, or if other treatments are not working. Medicines should be prescribed by a health care provider.  Relationships  Relationships can play a big part in helping you recover. Try to spend more time connecting with trusted friends and family members. Consider going to couples counseling, taking family education classes, or going to family therapy. Therapy can help you and others better understand the condition.  How to recognize changes in your condition  Everyone has a different response to treatment for anxiety. Recovery from anxiety happens when symptoms decrease and stop interfering with your daily activities at home or work. This may mean that you will start to:  · Have better concentration and focus.  · Sleep better.  · Be less irritable.  · Have more energy.  · Have improved memory.  It is   important to recognize when your condition is getting worse. Contact your health care provider if your symptoms interfere with home or work and you do not feel like your condition is improving.  Where to find help and support:  You can get help and support from these sources:  · Self-help groups.  · Online and community organizations.  · A trusted spiritual leader.  · Couples counseling.  · Family education classes.  · Family therapy.  Follow these instructions  at home:  · Eat a healthy diet that includes plenty of vegetables, fruits, whole grains, low-fat dairy products, and lean protein. Do not eat a lot of foods that are high in solid fats, added sugars, or salt.  · Exercise. Most adults should do the following:  ? Exercise for at least 150 minutes each week. The exercise should increase your heart rate and make you sweat (moderate-intensity exercise).  ? Strengthening exercises at least twice a week.  · Cut down on caffeine, tobacco, alcohol, and other potentially harmful substances.  · Get the right amount and quality of sleep. Most adults need 7-9 hours of sleep each night.  · Make choices that simplify your life.  · Take over-the-counter and prescription medicines only as told by your health care provider.  · Avoid caffeine, alcohol, and certain over-the-counter cold medicines. These may make you feel worse. Ask your pharmacist which medicines to avoid.  · Keep all follow-up visits as told by your health care provider. This is important.  Questions to ask your health care provider  · Would I benefit from therapy?  · How often should I follow up with a health care provider?  · How long do I need to take medicine?  · Are there any long-term side effects of my medicine?  · Are there any alternatives to taking medicine?  Contact a health care provider if:  · You have a hard time staying focused or finishing daily tasks.  · You spend many hours a day feeling worried about everyday life.  · You become exhausted by worry.  · You start to have headaches, feel tense, or have nausea.  · You urinate more than normal.  · You have diarrhea.  Get help right away if:  · You have a racing heart and shortness of breath.  · You have thoughts of hurting yourself or others.  If you ever feel like you may hurt yourself or others, or have thoughts about taking your own life, get help right away. You can go to your nearest emergency department or call:  · Your local emergency services  (911 in the [place]  · A suicide crisis helpline, such as the National Suicide Prevention Lifeline at 1-800-273-8255. This is open 24-hours a day.  Summary  · Taking steps to deal with stress can help calm you.  · Medicines cannot cure anxiety disorders, but they can help ease symptoms.  · Family, friends, and partners can play a big part in helping you recover from an anxiety disorder.  This information is not intended to replace advice given to you by your health care provider. Make sure you discuss any questions you have with your health care provider.  Document Released: 01/26/2016 Document Revised: 01/26/2016 Document Reviewed: 01/26/2016  Elsevier Interactive Patient Education © 2019 [employer]

## 2018-05-11 ENCOUNTER — Telehealth: Payer: Self-pay | Admitting: Family Medicine

## 2018-05-11 NOTE — Telephone Encounter (Signed)
Please advise 

## 2018-05-11 NOTE — Telephone Encounter (Signed)
Pt has enough refills in her pharmacy

## 2018-05-11 NOTE — Telephone Encounter (Signed)
Copied from CRM [id number]. Topic: Quick Communication - Rx Refill/Question >> May 11, 2018  9:08 AM Herby Abraham C wrote: Medication: amphetamine-dextroamphetamine (ADDERALL) 10 MG tablet   Has the patient contacted their pharmacy? Yes   (Agent: If no, request that the patient contact the pharmacy for the refill.) (Agent: If yes, when and what did the pharmacy advise?)  Preferred Pharmacy (with phone number or street name): [id number] - [place], [place] - [address] [phone] (Phone) [phone] (Fax)    Agent: Please be advised that RX refills may take up to 3 business days. We ask that you follow-up with your pharmacy.

## 2018-05-15 NOTE — Telephone Encounter (Signed)
Spoke with pt pharmacy states that pt needs to call them when she gets low, pt is aware to pick up Rx from her pharmacy

## 2018-07-17 ENCOUNTER — Telehealth: Payer: Self-pay | Admitting: Family Medicine

## 2018-07-17 NOTE — Telephone Encounter (Signed)
Copied from CRM [id number]. Topic: General - Other >> Jul 17, 2018  1:22 PM Leafy Ro wrote: Reason for CRM: pt is calling and needs a refill on generic adderall 10 mg. [facility] [address]

## 2018-07-18 NOTE — Telephone Encounter (Signed)
Pt LOV and last refill was 04/13/2018, please advise

## 2018-07-20 NOTE — Telephone Encounter (Signed)
Patient is checking on status of medication. Patient has no more pills left. Generic Adderall amphetamine-dextroamphetamine (ADDERALL) 10 MG tablet  [id number] - [place], [place] - [address] [phone] (Phone) [phone] (Fax)   Patient  Call back #[phone]

## 2018-07-22 ENCOUNTER — Other Ambulatory Visit: Payer: Self-pay | Admitting: Family Medicine

## 2018-07-22 DIAGNOSIS — F909 Attention-deficit hyperactivity disorder, unspecified type: Secondary | ICD-10-CM

## 2018-07-22 MED ORDER — AMPHETAMINE-DEXTROAMPHETAMINE 10 MG PO TABS: 10.0000 mg | ORAL_TABLET | Freq: Two times a day (BID) | ORAL | 0 refills | Status: DC

## 2018-07-22 NOTE — Telephone Encounter (Signed)
adderall refilled

## 2018-08-20 ENCOUNTER — Telehealth: Payer: Self-pay | Admitting: Family Medicine

## 2018-08-20 NOTE — Telephone Encounter (Signed)
Medication: amphetamine-dextroamphetamine (ADDERALL) 10 MG tablet  Has the patient contacted their pharmacy? yes   Preferred Pharmacy (with phone number or street name):  [id number] - [place], [place] [phone] (Phone) [phone] (Fax)   Agent: Please be advised that RX refills may take up to 3 business days. We ask that you follow-up with your pharmacy.

## 2018-08-20 NOTE — Telephone Encounter (Signed)
Per chart pt was sent in 3  Scripts on 07/22/18.  Will need to check with pharmacy about this.

## 2018-08-21 NOTE — Telephone Encounter (Signed)
Called pt pharmacy states that pt has enough refills left. Spoke with pt and is aware

## 2018-08-24 ENCOUNTER — Ambulatory Visit (INDEPENDENT_AMBULATORY_CARE_PROVIDER_SITE_OTHER): Payer: BC Managed Care – PPO | Admitting: Adult Health

## 2018-08-24 ENCOUNTER — Other Ambulatory Visit: Payer: Self-pay

## 2018-08-24 ENCOUNTER — Encounter: Payer: Self-pay | Admitting: Adult Health

## 2018-08-24 DIAGNOSIS — J0110 Acute frontal sinusitis, unspecified: Secondary | ICD-10-CM | POA: Diagnosis not present

## 2018-08-24 MED ORDER — AZITHROMYCIN 250 MG PO TABS: 250.0000 mg | ORAL_TABLET | Freq: Every day | ORAL | 0 refills | Status: DC

## 2018-08-24 NOTE — Progress Notes (Signed)
Virtual Visit via Video Note  I connected with Latoya Martinez on 08/24/18 at  3:30 PM EDT by a video enabled telemedicine application and verified that I am speaking with the correct person using two identifiers.  Location patient: home Location provider:work or home office Persons participating in the virtual visit: patient, provider  I discussed the limitations of evaluation and management by telemedicine and the availability of in person appointments. The patient expressed understanding and agreed to proceed.   HPI: 53 year old female who is being evaluated today for an acute issue.  Symptoms include frontal headache, sinus pain and pressure, sinus congestion, and the feeling of ear fullness.  Her symptoms have been present for 4 weeks.  She denies cough, muscle aches, fevers, chills, or ear pain.  She has been using Mucinex and her daily over-the-counter allergy medication and did not notice any improvement.   ROS: See pertinent positives and negatives per HPI.  Past Medical History:  Diagnosis Date  . Allergy   . Heart murmur     Past Surgical History:  Procedure Laterality Date  . ABDOMINAL HYSTERECTOMY      Family History  Problem Relation Age of Onset  . Arthritis Mother   . Cancer Mother   . Hearing loss Mother   . Cancer Father   . Depression Sister   . Asthma Brother        Current Outpatient Medications:  .  amphetamine-dextroamphetamine (ADDERALL) 10 MG tablet, Take 1 tablet (10 mg total) by mouth 2 (two) times daily for 7 days., Disp: 14 tablet, Rfl: 0 .  amphetamine-dextroamphetamine (ADDERALL) 10 MG tablet, Take 1 tablet (10 mg total) by mouth 2 (two) times daily., Disp: 60 tablet, Rfl: 0 .  amphetamine-dextroamphetamine (ADDERALL) 10 MG tablet, Take 1 tablet (10 mg total) by mouth 2 (two) times daily with a meal., Disp: 60 tablet, Rfl: 0 .  amphetamine-dextroamphetamine (ADDERALL) 10 MG tablet, Take 1 tablet (10 mg total) by mouth 2 (two) times daily.,  Disp: 60 tablet, Rfl: 0 .  hydrOXYzine (ATARAX/VISTARIL) 25 MG tablet, Take 1 tablet (25 mg total) by mouth 3 (three) times daily as needed for anxiety., Disp: 30 tablet, Rfl: 0  EXAM:  VITALS per patient if applicable:  GENERAL: alert, oriented, appears well and in no acute distress  HEENT: atraumatic, conjunttiva clear, no obvious abnormalities on inspection of external nose and ears  NECK: normal movements of the head and neck  LUNGS: on inspection no signs of respiratory distress, breathing rate appears normal, no obvious gross SOB, gasping or wheezing  CV: no obvious cyanosis  MS: moves all visible extremities without noticeable abnormality  PSYCH/NEURO: pleasant and cooperative, no obvious depression or anxiety, speech and thought processing grossly intact  ASSESSMENT AND PLAN:  Discussed the following assessment and plan:  1. Acute non-recurrent frontal sinusitis -No concern for COVID-19.  Will treat due to length of time of symptoms.  She was advised to follow-up if no improvement in the next 2 to 3 days - azithromycin (ZITHROMAX) 250 MG tablet; Take 1 tablet (250 mg total) by mouth daily. Take two tabs on day 1 and then 1 tab on days 2-5  Dispense: 6 tablet; Refill: 0     I discussed the assessment and treatment plan with the patient. The patient was provided an opportunity to ask questions and all were answered. The patient agreed with the plan and demonstrated an understanding of the instructions.   The patient was advised to call back or seek an  in-person evaluation if the symptoms worsen or if the condition fails to improve as anticipated.   Shirline Frees, NP

## 2018-09-10 ENCOUNTER — Encounter: Payer: Self-pay | Admitting: Family Medicine

## 2018-09-10 ENCOUNTER — Ambulatory Visit (INDEPENDENT_AMBULATORY_CARE_PROVIDER_SITE_OTHER): Payer: BC Managed Care – PPO | Admitting: Family Medicine

## 2018-09-10 ENCOUNTER — Other Ambulatory Visit: Payer: Self-pay

## 2018-09-10 DIAGNOSIS — J0101 Acute recurrent maxillary sinusitis: Secondary | ICD-10-CM

## 2018-09-10 MED ORDER — DOXYCYCLINE HYCLATE 100 MG PO TABS: 100.0000 mg | ORAL_TABLET | Freq: Two times a day (BID) | ORAL | 0 refills | Status: DC

## 2018-09-10 MED ORDER — FLUTICASONE PROPIONATE 50 MCG/ACT NA SUSP: 2.0000 | Freq: Every day | NASAL | 1 refills | Status: DC

## 2018-09-10 NOTE — Progress Notes (Signed)
Virtual Visit via Video Note  I connected with Latoya Martinez on 09/10/18 at  2:00 PM EDT by a video enabled telemedicine application and verified that I am speaking with the correct person using two identifiers.  Location patient: home Location provider:work or home office Persons participating in the virtual visit: patient, provider  I discussed the limitations of evaluation and management by telemedicine and the availability of in person appointments. The patient expressed understanding and agreed to proceed.   HPI: Pt with HA, facial pressure and edema in face under eyes x 4 + wks.  She was given a zpak on 7/10, felt better x 7 days but symptoms returned.  Has popping in ears, congestion, HA, pressure in in face.  Denies fever, cough, sore throat, ear pain.  Pt started taking leftover prednisone 20 mg for the last 10 days. States has been taking half a tab recently.  Pt flying to [place] for a business trip in am.   When symptoms first started pt ignored them as she was busy taking care of her father who had heart surgery.   ROS: See pertinent positives and negatives per HPI.  Past Medical History:  Diagnosis Date  . Allergy   . Heart murmur     Past Surgical History:  Procedure Laterality Date  . ABDOMINAL HYSTERECTOMY      Family History  Problem Relation Age of Onset  . Arthritis Mother   . Cancer Mother   . Hearing loss Mother   . Cancer Father   . Depression Sister   . Asthma Brother      Current Outpatient Medications:  .  amphetamine-dextroamphetamine (ADDERALL) 10 MG tablet, Take 1 tablet (10 mg total) by mouth 2 (two) times daily for 7 days., Disp: 14 tablet, Rfl: 0 .  amphetamine-dextroamphetamine (ADDERALL) 10 MG tablet, Take 1 tablet (10 mg total) by mouth 2 (two) times daily., Disp: 60 tablet, Rfl: 0 .  amphetamine-dextroamphetamine (ADDERALL) 10 MG tablet, Take 1 tablet (10 mg total) by mouth 2 (two) times daily with a meal., Disp: 60 tablet, Rfl: 0 .   amphetamine-dextroamphetamine (ADDERALL) 10 MG tablet, Take 1 tablet (10 mg total) by mouth 2 (two) times daily., Disp: 60 tablet, Rfl: 0 .  azithromycin (ZITHROMAX) 250 MG tablet, Take 1 tablet (250 mg total) by mouth daily. Take two tabs on day 1 and then 1 tab on days 2-5, Disp: 6 tablet, Rfl: 0 .  hydrOXYzine (ATARAX/VISTARIL) 25 MG tablet, Take 1 tablet (25 mg total) by mouth 3 (three) times daily as needed for anxiety., Disp: 30 tablet, Rfl: 0  EXAM:  VITALS per patient if applicable:  RR between 12-20 bpm  GENERAL: alert, oriented, appears well and in no acute distress  HEENT: atraumatic, conjunctiva clear, no obvious abnormalities on inspection of external nose and ears  NECK: normal movements of the head and neck  LUNGS: on inspection no signs of respiratory distress, breathing rate appears normal, no obvious gross SOB, gasping or wheezing  CV: no obvious cyanosis  MS: moves all visible extremities without noticeable abnormality  PSYCH/NEURO: pleasant and cooperative, no obvious depression or anxiety, speech and thought processing grossly intact  ASSESSMENT AND PLAN:  Discussed the following assessment and plan:  Acute recurrent maxillary sinusitis  -s/p treatment with Azithromycin 7/10 with return of symptoms -pt advised not to take left over meds such as prednisone.  Discussed certain meds such as prednisone need to be tapered with continued use. - Plan: fluticasone (FLONASE) 50 MCG/ACT nasal  spray, doxycycline (VIBRA-TABS) 100 MG tablet -given precautions  F/u prn   I discussed the assessment and treatment plan with the patient. The patient was provided an opportunity to ask questions and all were answered. The patient agreed with the plan and demonstrated an understanding of the instructions.   The patient was advised to call back or seek an in-person evaluation if the symptoms worsen or if the condition fails to improve as anticipated.    Deeann SaintShannon R Kylani Wires, MD

## 2018-09-17 ENCOUNTER — Encounter: Payer: Self-pay | Admitting: Family Medicine

## 2018-09-17 ENCOUNTER — Other Ambulatory Visit: Payer: Self-pay

## 2018-09-17 ENCOUNTER — Telehealth (INDEPENDENT_AMBULATORY_CARE_PROVIDER_SITE_OTHER): Payer: BC Managed Care – PPO | Admitting: Family Medicine

## 2018-09-17 DIAGNOSIS — J0100 Acute maxillary sinusitis, unspecified: Secondary | ICD-10-CM | POA: Diagnosis not present

## 2018-09-17 NOTE — Telephone Encounter (Signed)
Pt stated that she had a virtual visit today and she was told that she would receive a letter on [facility] to provide to her employer but the letter is not showing. Pt stated she made Dr. Elease Hashimoto aware that the letter was urgent so she really needs her [employer] account udated with the letter.

## 2018-09-17 NOTE — Progress Notes (Signed)
This visit type was conducted due to national recommendations for restrictions regarding the COVID-19 pandemic in an effort to limit this patient's exposure and mitigate transmission in our community.   Virtual Visit via Video Note  I connected with Latoya Martinez on 09/17/18 at  1:30 PM EDT by a video enabled telemedicine application and verified that I am speaking with the correct person using two identifiers.  Location patient: home Location provider:work or home office Persons participating in the virtual visit: patient, provider  I discussed the limitations of evaluation and management by telemedicine and the availability of in person appointments. The patient expressed understanding and agreed to proceed.   HPI: Patient has been battling some sinus congestion and facial pain issues now for several weeks.  She had visit on 10 July and was placed on Zithromax.  She had no improvement.  She then saw her primary on the 27th and was changed to doxycycline.  She took first dose on empty stomach and had some nausea and vomiting.  Since then, she is taking with light food and tolerating well.  Her facial pain is improving.  She denies any fever.  She is improving somewhat but calls basically requesting a work note through next week.  Her job requires frequent travel.  She sometimes works up to 15 hours/day.  She has had rare cough.  No dyspnea.  Had symptoms now for several weeks has had very similar symptoms in the past with sinusitis.  She does have long history of smoking.   ROS: See pertinent positives and negatives per HPI.  Past Medical History:  Diagnosis Date  . Allergy   . Heart murmur     Past Surgical History:  Procedure Laterality Date  . ABDOMINAL HYSTERECTOMY      Family History  Problem Relation Age of Onset  . Arthritis Mother   . Cancer Mother   . Hearing loss Mother   . Cancer Father   . Depression Sister   . Asthma Brother     SOCIAL HX: Current  smoker   Current Outpatient Medications:  .  amphetamine-dextroamphetamine (ADDERALL) 10 MG tablet, Take 1 tablet (10 mg total) by mouth 2 (two) times daily for 7 days., Disp: 14 tablet, Rfl: 0 .  amphetamine-dextroamphetamine (ADDERALL) 10 MG tablet, Take 1 tablet (10 mg total) by mouth 2 (two) times daily., Disp: 60 tablet, Rfl: 0 .  amphetamine-dextroamphetamine (ADDERALL) 10 MG tablet, Take 1 tablet (10 mg total) by mouth 2 (two) times daily with a meal., Disp: 60 tablet, Rfl: 0 .  amphetamine-dextroamphetamine (ADDERALL) 10 MG tablet, Take 1 tablet (10 mg total) by mouth 2 (two) times daily., Disp: 60 tablet, Rfl: 0 .  azithromycin (ZITHROMAX) 250 MG tablet, Take 1 tablet (250 mg total) by mouth daily. Take two tabs on day 1 and then 1 tab on days 2-5, Disp: 6 tablet, Rfl: 0 .  doxycycline (VIBRA-TABS) 100 MG tablet, Take 1 tablet (100 mg total) by mouth 2 (two) times daily., Disp: 20 tablet, Rfl: 0 .  fluticasone (FLONASE) 50 MCG/ACT nasal spray, Place 2 sprays into both nostrils daily., Disp: 16 g, Rfl: 1 .  hydrOXYzine (ATARAX/VISTARIL) 25 MG tablet, Take 1 tablet (25 mg total) by mouth 3 (three) times daily as needed for anxiety., Disp: 30 tablet, Rfl: 0  EXAM:  VITALS per patient if applicable:  GENERAL: alert, oriented, appears well and in no acute distress  HEENT: atraumatic, conjunttiva clear, no obvious abnormalities on inspection of external nose and ears  NECK: normal movements of the head and neck  LUNGS: on inspection no signs of respiratory distress, breathing rate appears normal, no obvious gross SOB, gasping or wheezing  CV: no obvious cyanosis  MS: moves all visible extremities without noticeable abnormality  PSYCH/NEURO: pleasant and cooperative, no obvious depression or anxiety, speech and thought processing grossly intact  ASSESSMENT AND PLAN:  Discussed the following assessment and plan:  Acute versus chronic sinusitis improving gradually on  doxycycline  -Patient requesting work note to return to work on 13 August.  This seems reasonable.  Even though this seems like a low risk for COVID she has had adequate time of observation that, even in the slim chance this was coronavirus infection, she should not be actively infected at that time.     I discussed the assessment and treatment plan with the patient. The patient was provided an opportunity to ask questions and all were answered. The patient agreed with the plan and demonstrated an understanding of the instructions.   The patient was advised to call back or seek an in-person evaluation if the symptoms worsen or if the condition fails to improve as anticipated.   Carolann Littler, MD

## 2018-09-24 ENCOUNTER — Telehealth: Payer: Self-pay | Admitting: Family Medicine

## 2018-09-24 NOTE — Telephone Encounter (Signed)
Pt has a rx for one more month on file at her pharmacy.  Refill due 10/22/2018.

## 2018-09-24 NOTE — Telephone Encounter (Signed)
Medication: amphetamine-dextroamphetamine (ADDERALL) 10 MG tablet     Patient is requesting refill.    [facility]:  [id number] [place], [place] [phone] (Phone) [phone] (Fax)

## 2018-09-24 NOTE — Telephone Encounter (Signed)
See request °

## 2018-09-24 NOTE — Telephone Encounter (Signed)
Dr. Banks please advise  

## 2018-10-09 ENCOUNTER — Other Ambulatory Visit: Payer: Self-pay

## 2018-10-09 ENCOUNTER — Telehealth (INDEPENDENT_AMBULATORY_CARE_PROVIDER_SITE_OTHER): Payer: BC Managed Care – PPO | Admitting: Family Medicine

## 2018-10-09 DIAGNOSIS — J329 Chronic sinusitis, unspecified: Secondary | ICD-10-CM | POA: Diagnosis not present

## 2018-10-09 DIAGNOSIS — F909 Attention-deficit hyperactivity disorder, unspecified type: Secondary | ICD-10-CM | POA: Diagnosis not present

## 2018-10-09 MED ORDER — FLUTICASONE PROPIONATE 50 MCG/ACT NA SUSP: 1.0000 | Freq: Two times a day (BID) | NASAL | 11 refills | Status: DC | PRN

## 2018-10-09 MED ORDER — AMPHETAMINE-DEXTROAMPHETAMINE 10 MG PO TABS: 10.0000 mg | ORAL_TABLET | Freq: Two times a day (BID) | ORAL | 0 refills | Status: DC

## 2018-10-09 NOTE — Progress Notes (Signed)
Virtual Visit via Video Note  I connected with Latoya Martinez on 10/09/18 at  3:30 PM EDT by a video enabled telemedicine application 2/2 [id number] pandemic and verified that I am speaking with the correct person using two identifiers.  Location patient: home Location provider:work or home office Persons participating in the virtual visit: patient, provider  I discussed the limitations of evaluation and management by telemedicine and the availability of in person appointments. The patient expressed understanding and agreed to proceed.   HPI: Pt with continued sinus issues.  Was on azithromycine and doxycycline, off abx x 2 wks.  Notices pressure in sinuses when laying down and first thing in am.  Feeling better but feels like symptoms are lingering, having popping in ears.  Using flonase q am.  Endorses h/o sinus issues, seen by ENT in [place] and in [place], [place] in the past.  Advised not to use saline rinse.  Taking adderall 10 mg BID for ADHD.  States things are fine. Notes improvement in stress at work as her boss resigned. Pt working from home a few days per week. Still having to travel for work.  Social hx: pt's son is coming to visit this wknd.  ROS: See pertinent positives and negatives per HPI.  Past Medical History:  Diagnosis Date  . Allergy   . Heart murmur     Past Surgical History:  Procedure Laterality Date  . ABDOMINAL HYSTERECTOMY      Family History  Problem Relation Age of Onset  . Arthritis Mother   . Cancer Mother   . Hearing loss Mother   . Cancer Father   . Depression Sister   . Asthma Brother       Current Outpatient Medications:  .  amphetamine-dextroamphetamine (ADDERALL) 10 MG tablet, Take 1 tablet (10 mg total) by mouth 2 (two) times daily for 7 days., Disp: 14 tablet, Rfl: 0 .  amphetamine-dextroamphetamine (ADDERALL) 10 MG tablet, Take 1 tablet (10 mg total) by mouth 2 (two) times daily., Disp: 60 tablet, Rfl: 0 .   amphetamine-dextroamphetamine (ADDERALL) 10 MG tablet, Take 1 tablet (10 mg total) by mouth 2 (two) times daily with a meal., Disp: 60 tablet, Rfl: 0 .  amphetamine-dextroamphetamine (ADDERALL) 10 MG tablet, Take 1 tablet (10 mg total) by mouth 2 (two) times daily., Disp: 60 tablet, Rfl: 0 .  azithromycin (ZITHROMAX) 250 MG tablet, Take 1 tablet (250 mg total) by mouth daily. Take two tabs on day 1 and then 1 tab on days 2-5, Disp: 6 tablet, Rfl: 0 .  doxycycline (VIBRA-TABS) 100 MG tablet, Take 1 tablet (100 mg total) by mouth 2 (two) times daily., Disp: 20 tablet, Rfl: 0 .  fluticasone (FLONASE) 50 MCG/ACT nasal spray, Place 2 sprays into both nostrils daily., Disp: 16 g, Rfl: 1 .  hydrOXYzine (ATARAX/VISTARIL) 25 MG tablet, Take 1 tablet (25 mg total) by mouth 3 (three) times daily as needed for anxiety., Disp: 30 tablet, Rfl: 0  EXAM:  VITALS per patient if applicable: RR between 32-44 bpm  GENERAL: alert, oriented, appears well and in no acute distress  HEENT: atraumatic, conjunctiva clear, no obvious abnormalities on inspection of external nose and ears  NECK: normal movements of the head and neck  LUNGS: on inspection no signs of respiratory distress, breathing rate appears normal, no obvious gross SOB, gasping or wheezing  CV: no obvious cyanosis  MS: moves all visible extremities without noticeable abnormality  PSYCH/NEURO: pleasant and cooperative, no obvious depression or anxiety, speech and  thought processing grossly intact  ASSESSMENT AND PLAN:  Discussed the following assessment and plan:  Recurrent sinusitis -discussed controlling allergy symptoms. -discussed trying saline nasal spray in addition to flonase -abx course not indicated at this time.  Discussed abx overuse. - Plan: fluticasone (FLONASE) 50 MCG/ACT nasal spray, Ambulatory referral to ENT  Attention deficit hyperactivity disorder (ADHD), unspecified ADHD type  -stable - Plan:  amphetamine-dextroamphetamine (ADDERALL) 10 MG tablet, amphetamine-dextroamphetamine (ADDERALL) 10 MG tablet, amphetamine-dextroamphetamine (ADDERALL) 10 MG tablet  F/u prn   I discussed the assessment and treatment plan with the patient. The patient was provided an opportunity to ask questions and all were answered. The patient agreed with the plan and demonstrated an understanding of the instructions.   The patient was advised to call back or seek an in-person evaluation if the symptoms worsen or if the condition fails to improve as anticipated.    Deeann SaintShannon R Param Capri, MD

## 2018-10-11 DIAGNOSIS — J329 Chronic sinusitis, unspecified: Secondary | ICD-10-CM | POA: Diagnosis not present

## 2018-10-11 DIAGNOSIS — J300 Vasomotor rhinitis: Secondary | ICD-10-CM | POA: Diagnosis not present

## 2018-11-21 ENCOUNTER — Other Ambulatory Visit: Payer: Self-pay | Admitting: Family Medicine

## 2018-11-21 DIAGNOSIS — F909 Attention-deficit hyperactivity disorder, unspecified type: Secondary | ICD-10-CM

## 2018-11-21 NOTE — Telephone Encounter (Signed)
Requested medication (s) are due for refill today: yes  Requested medication (s) are on the active medication list: yes  Last refill:  10/09/2018  Future visit schedule: no  Notes to clinic:  Refill cannot be delegated    Requested Prescriptions  Pending Prescriptions Disp Refills   amphetamine-dextroamphetamine (ADDERALL) 10 MG tablet 60 tablet 0    Sig: Take 1 tablet (10 mg total) by mouth 2 (two) times daily with a meal.     Not Delegated - Psychiatry:  Stimulants/ADHD Failed - 11/21/2018 10:54 AM      Failed - This refill cannot be delegated      Failed - Urine Drug Screen completed in last 360 days.      Passed - Valid encounter within last 3 months    Recent Outpatient Visits          1 month ago Recurrent sinusitis   Therapist, music at [place], MD   2 months ago Acute maxillary sinusitis, recurrence not specified   Therapist, music at [facility], Alinda Sierras, MD   2 months ago Acute recurrent maxillary sinusitis   Therapist, music at [place], MD   2 months ago Acute non-recurrent frontal sinusitis   Therapist, music at [place], NP   7 months ago Attention deficit hyperactivity disorder (ADHD), unspecified ADHD type   Therapist, music at [facility], Langley Adie, MD

## 2018-11-21 NOTE — Telephone Encounter (Signed)
Unclear why this continues to be an issue month after month.  Three scripts were sent during the visit on 10/09/18. Pt needs to contact her pharmacy.  Pt is not due for refills and f/u visit until November.    Also, refills on Adderall are not urgent.

## 2018-11-21 NOTE — Telephone Encounter (Signed)
Copied from [place] [id number]. Topic: Quick Communication - Rx Refill/Question >> Nov 21, 2018 10:51 AM Rainey Pines A wrote: Medication: amphetamine-dextroamphetamine (ADDERALL) 10 MG tablet (Patient  Has the patient contacted their pharmacy? Yes (Agent: If no, request that the patient contact the pharmacy for the refill.) (Agent: If yes, when and what did the pharmacy advise?)Contact PCP  Preferred Pharmacy (with phone number or street name):[id number] [place] [place] - [address] [phone] (Phone) [phone] (Fax)    Agent: Please be advised that RX refills may take up to 3 business days. We ask that you follow-up with your pharmacy.

## 2018-11-21 NOTE — Telephone Encounter (Signed)
Pt LOV was 09/17/2018 and last refill was on 10/09/2018, Please advise

## 2018-11-21 NOTE — Telephone Encounter (Signed)
Please see previous response

## 2018-11-21 NOTE — Telephone Encounter (Signed)
Pt last refill  was on 09/17/2018 and LOV was 10/09/2018, please advise

## 2018-11-22 NOTE — Telephone Encounter (Signed)
Spoke with pt pharmacy states that pt pt has enough refills

## 2018-12-09 DIAGNOSIS — Z20828 Contact with and (suspected) exposure to other viral communicable diseases: Secondary | ICD-10-CM | POA: Diagnosis not present

## 2018-12-10 ENCOUNTER — Telehealth (INDEPENDENT_AMBULATORY_CARE_PROVIDER_SITE_OTHER): Payer: BC Managed Care – PPO | Admitting: Family Medicine

## 2018-12-10 ENCOUNTER — Ambulatory Visit: Payer: Self-pay | Admitting: *Deleted

## 2018-12-10 DIAGNOSIS — R509 Fever, unspecified: Secondary | ICD-10-CM

## 2018-12-10 DIAGNOSIS — Z20822 Contact with and (suspected) exposure to covid-19: Secondary | ICD-10-CM

## 2018-12-10 DIAGNOSIS — R112 Nausea with vomiting, unspecified: Secondary | ICD-10-CM | POA: Diagnosis not present

## 2018-12-10 DIAGNOSIS — B349 Viral infection, unspecified: Secondary | ICD-10-CM | POA: Diagnosis not present

## 2018-12-10 DIAGNOSIS — Z20828 Contact with and (suspected) exposure to other viral communicable diseases: Secondary | ICD-10-CM

## 2018-12-10 MED ORDER — ONDANSETRON HCL 4 MG PO TABS: 4.0000 mg | ORAL_TABLET | Freq: Three times a day (TID) | ORAL | 0 refills | Status: DC | PRN

## 2018-12-10 NOTE — Progress Notes (Signed)
Virtual Visit via Video Note  I connected with Anderson Malta A. Chim on 12/10/18 at  1:00 PM EDT by a video enabled telemedicine application 2/2 [id number] pandemic and verified that I am speaking with the correct person using two identifiers.  Location patient: home Location provider:work or home office Persons participating in the virtual visit: patient, provider  I discussed the limitations of evaluation and management by telemedicine and the availability of in person appointments. The patient expressed understanding and agreed to proceed.   HPI: Pt states she saw a friend Tuesday night for 45 mins.  The friend started feeling sick on Thursday, then tested positive for COVID-19 on Saturday.  By Friday night pt developed fatigue, the R side of her throat felt like "knives going down it".  On Saturday pt had fever Tmax 100 F, chills, body aches, decreased appetite.  [facility]  Pt now with nausea/vomiting, abdominal pain.  Pt denies diarrhea, loss of smell or taste, cough.  ROS: See pertinent positives and negatives per HPI.  Past Medical History:  Diagnosis Date  . Allergy   . Heart murmur     Past Surgical History:  Procedure Laterality Date  . ABDOMINAL HYSTERECTOMY      Family History  Problem Relation Age of Onset  . Arthritis Mother   . Cancer Mother   . Hearing loss Mother   . Cancer Father   . Depression Sister   . Asthma Brother      Current Outpatient Medications:  .  amphetamine-dextroamphetamine (ADDERALL) 10 MG tablet, Take 1 tablet (10 mg total) by mouth 2 (two) times daily for 7 days., Disp: 14 tablet, Rfl: 0 .  amphetamine-dextroamphetamine (ADDERALL) 10 MG tablet, Take 1 tablet (10 mg total) by mouth 2 (two) times daily., Disp: 60 tablet, Rfl: 0 .  amphetamine-dextroamphetamine (ADDERALL) 10 MG tablet, Take 1 tablet (10 mg total) by mouth 2 (two) times daily with a meal., Disp: 60 tablet, Rfl: 0 .   amphetamine-dextroamphetamine (ADDERALL) 10 MG tablet, Take 1 tablet (10 mg total) by mouth 2 (two) times daily., Disp: 60 tablet, Rfl: 0 .  azithromycin (ZITHROMAX) 250 MG tablet, Take 1 tablet (250 mg total) by mouth daily. Take two tabs on day 1 and then 1 tab on days 2-5, Disp: 6 tablet, Rfl: 0 .  doxycycline (VIBRA-TABS) 100 MG tablet, Take 1 tablet (100 mg total) by mouth 2 (two) times daily., Disp: 20 tablet, Rfl: 0 .  fluticasone (FLONASE) 50 MCG/ACT nasal spray, Place 1 spray into both nostrils 2 (two) times daily as needed for allergies or rhinitis., Disp: 16 g, Rfl: 11 .  hydrOXYzine (ATARAX/VISTARIL) 25 MG tablet, Take 1 tablet (25 mg total) by mouth 3 (three) times daily as needed for anxiety., Disp: 30 tablet, Rfl: 0  EXAM:  VITALS per patient if applicable:  GENERAL: alert, oriented, appears well and in no acute distress  HEENT: atraumatic, conjunctiva clear, no obvious abnormalities on inspection of external nose and ears  NECK: normal movements of the head and neck  LUNGS: on inspection no signs of respiratory distress, breathing rate appears normal, no obvious gross SOB, gasping or wheezing  CV: no obvious cyanosis  MS: moves all visible extremities without noticeable abnormality  PSYCH/NEURO: pleasant and cooperative, no obvious depression or anxiety, speech and thought processing grossly intact  ASSESSMENT AND PLAN:  Discussed the following assessment and plan:  Fever with exposure to COVID-19 virus -pt with known COVID-19 exposure, now  symptomatic -[facility] -advised to take Tylenol prn for fever, pain/discomfort -given precautions for worsening symptoms  Viral illness -supportive care: flonase, OTC cough and cold meds prn, gargling with warm salt water or chloraseptic spray  Nausea and vomiting, non-intractable -discussed staying hydrated.  Take small sips of electrolyte replacement solution. -Plan: Zofran 4 mg q 8 hrs  prn -given precautions  F/u prn   I discussed the assessment and treatment plan with the patient. The patient was provided an opportunity to ask questions and all were answered. The patient agreed with the plan and demonstrated an understanding of the instructions.   The patient was advised to call back or seek an in-person evaluation if the symptoms worsen or if the condition fails to improve as anticipated.  [name], MD

## 2018-12-10 NOTE — Telephone Encounter (Signed)
Summary: Vomiting   Pt requesting call back, Pt states she has been vomiting since 10pm last night and has fever, chills, loss of appetite, body aches. Pt also states she is [facility] Please call pt: [phone]      Patient started symptoms this week end- she has had exposure to [place]. Patient is awaiting COVID testing.Patient is very anxious due to her health issues- she states she has low BP history and passed out while in bathroom last night. Discussed hydration and help with prescriptive medication- call to office for appointment.  Reason for Disposition . Fever present > 3 days (72 hours)  Answer Assessment - Initial Assessment Questions 1. COVID-19 DIAGNOSIS: "Who made your Coronavirus (COVID-19) diagnosis?" "Was it confirmed by a positive lab test?" If not diagnosed by a HCP, ask "Are there lots of cases (community spread) where you live?" (See public health department website, if unsure)     Exposure- from friend 2. ONSET: "When did the COVID-19 symptoms start?"      Friday night 3. WORST SYMPTOM: "What is your worst symptom?" (e.g., cough, fever, shortness of breath, muscle aches)     vomiting 4. COUGH: "Do you have a cough?" If so, ask: "How bad is the cough?"       Patient has coughed- but not today 5. FEVER: "Do you have a fever?" If so, ask: "What is your temperature, how was it measured, and when did it start?"     Last taken yesterday- 100, oral thermometer 6. RESPIRATORY STATUS: "Describe your breathing?" (e.g., shortness of breath, wheezing, unable to speak)      Doing well 7. BETTER-SAME-WORSE: "Are you getting better, staying the same or getting worse compared to yesterday?"  If getting worse, ask, "In what way?"     Worse- vomiting 8. HIGH RISK DISEASE: "Do you have any chronic medical problems?" (e.g., asthma, heart or lung disease, weak immune system, etc.)     yes 9. PREGNANCY: "Is there any chance you are pregnant?" "When was  your last menstrual period?"     n/a 10. OTHER SYMPTOMS: "Do you have any other symptoms?"  (e.g., chills, fatigue, headache, loss of smell or taste, muscle pain, sore throat)       *No Answer*  Protocols used: CORONAVIRUS (COVID-19) DIAGNOSED OR SUSPECTED-A-AH

## 2018-12-18 DIAGNOSIS — S52121A Displaced fracture of head of right radius, initial encounter for closed fracture: Secondary | ICD-10-CM | POA: Diagnosis not present

## 2018-12-18 DIAGNOSIS — M79601 Pain in right arm: Secondary | ICD-10-CM | POA: Diagnosis not present

## 2018-12-21 ENCOUNTER — Telehealth: Payer: Self-pay | Admitting: Family Medicine

## 2018-12-21 DIAGNOSIS — F909 Attention-deficit hyperactivity disorder, unspecified type: Secondary | ICD-10-CM

## 2018-12-21 NOTE — Telephone Encounter (Signed)
Medication Refill - Medication: adderall   Has the patient contacted their pharmacy? Yes.   (Agent: If no, request that the patient contact the pharmacy for the refill.) (Agent: If yes, when and what did the pharmacy advise?)  Preferred Pharmacy (with phone number or street name):  [id number] [place], [place] [address]  Phone: [phone] Fax: [phone]  Not a 24 hour pharmacy; exact hours not known.     Agent: Please be advised that RX refills may take up to 3 business days. We ask that you follow-up with your pharmacy.

## 2018-12-21 NOTE — Telephone Encounter (Signed)
Spoke with pharmacy and patient has another script on file that she can pick up tomorrow. Patient has been notified

## 2018-12-26 DIAGNOSIS — M25521 Pain in right elbow: Secondary | ICD-10-CM | POA: Diagnosis not present

## 2019-01-23 DIAGNOSIS — M79601 Pain in right arm: Secondary | ICD-10-CM | POA: Diagnosis not present

## 2019-01-24 ENCOUNTER — Other Ambulatory Visit: Payer: Self-pay | Admitting: Family Medicine

## 2019-01-24 DIAGNOSIS — F909 Attention-deficit hyperactivity disorder, unspecified type: Secondary | ICD-10-CM

## 2019-01-24 MED ORDER — AMPHETAMINE-DEXTROAMPHETAMINE 10 MG PO TABS: 10.0000 mg | ORAL_TABLET | Freq: Two times a day (BID) | ORAL | 0 refills | Status: DC

## 2019-01-24 NOTE — Telephone Encounter (Signed)
Requested medication (s) are due for refill today: yes  Requested medication (s) are on the active medication list: yes  Last refill:  10/09/2018  Future visit scheduled:no  Notes to clinic:  refill cannot be delegated    Requested Prescriptions  Pending Prescriptions Disp Refills   amphetamine-dextroamphetamine (ADDERALL) 10 MG tablet 60 tablet 0    Sig: Take 1 tablet (10 mg total) by mouth 2 (two) times daily with a meal.      Not Delegated - Psychiatry:  Stimulants/ADHD Failed - 01/24/2019  9:23 AM      Failed - This refill cannot be delegated      Failed - Urine Drug Screen completed in last 360 days.      Passed - Valid encounter within last 3 months    Recent Outpatient Visits           1 month ago Fever with exposure to COVID-19 virus   Therapist, music at [place], MD   3 months ago Recurrent sinusitis   Therapist, music at [place], MD   4 months ago Acute maxillary sinusitis, recurrence not specified   Therapist, music at [facility], Alinda Sierras, MD   4 months ago Acute recurrent maxillary sinusitis   Therapist, music at [place], MD   5 months ago Acute non-recurrent frontal sinusitis   Therapist, music at [facility], [place], [place]

## 2019-01-24 NOTE — Telephone Encounter (Signed)
Medication Refill - Medication: amphetamine-dextroamphetamine (ADDERALL) 10 MG tablet    Preferred Pharmacy (with phone number or street name):  [id number] [place], [place] Phone:  [phone]  Fax:  [phone]       Agent: Please be advised that RX refills may take up to 3 business days. We ask that you follow-up with your pharmacy.

## 2019-02-20 ENCOUNTER — Telehealth: Payer: Self-pay | Admitting: Family Medicine

## 2019-02-20 DIAGNOSIS — F909 Attention-deficit hyperactivity disorder, unspecified type: Secondary | ICD-10-CM

## 2019-02-20 DIAGNOSIS — M25521 Pain in right elbow: Secondary | ICD-10-CM | POA: Diagnosis not present

## 2019-02-20 NOTE — Telephone Encounter (Signed)
amphetamine-dextroamphetamine (ADDERALL) 10 MG tablet     Patient requesting refill.    Pharmacy:  [id number] [name], [place] - [address] Phone:  [phone]  Fax:  [phone]

## 2019-02-20 NOTE — Telephone Encounter (Signed)
Patient advise that script is at pharmacy and will be ready for pick up on 02/23/2019 per pharmacy

## 2019-03-22 ENCOUNTER — Other Ambulatory Visit: Payer: Self-pay | Admitting: Adult Health

## 2019-03-25 ENCOUNTER — Telehealth: Payer: Self-pay | Admitting: Family Medicine

## 2019-03-25 NOTE — Telephone Encounter (Signed)
Pt is requesting a refill on both Adderall 10 MG tablet.  Thanks   [facility] [address]

## 2019-03-25 NOTE — Telephone Encounter (Signed)
Pt has been scheduled for a virtual visit on 03/27/2019

## 2019-03-26 NOTE — Telephone Encounter (Signed)
Message routed to PCP CMA  

## 2019-03-26 NOTE — Telephone Encounter (Signed)
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 Explained to pt that due to her being on Adderal which is considered a control substance she will need f/u every 6 months.Pt interrupted me as I explained but she kept telling me to let her talk. Pt state that she should receive the link for her virtual visit 5 minutes before her appointment time, advised pt that Dr Salomon Fick will connect with her soon after she is done with 2 pm pt.

## 2019-03-27 ENCOUNTER — Telehealth (INDEPENDENT_AMBULATORY_CARE_PROVIDER_SITE_OTHER): Payer: BC Managed Care – PPO | Admitting: Family Medicine

## 2019-03-27 DIAGNOSIS — F909 Attention-deficit hyperactivity disorder, unspecified type: Secondary | ICD-10-CM | POA: Diagnosis not present

## 2019-03-27 DIAGNOSIS — Z8781 Personal history of (healed) traumatic fracture: Secondary | ICD-10-CM | POA: Diagnosis not present

## 2019-03-27 MED ORDER — AMPHETAMINE-DEXTROAMPHETAMINE 10 MG PO TABS: 10.0000 mg | ORAL_TABLET | Freq: Two times a day (BID) | ORAL | 0 refills | Status: DC

## 2019-03-27 NOTE — Progress Notes (Signed)
Virtual Visit via Video Note  I connected with Latoya Martinez on 03/27/19 at  2:30 PM EST by a video enabled telemedicine application 2/2 [id number] pandemic and verified that I am speaking with the correct person using two identifiers.  Location patient: home Location provider:work or home office Persons participating in the virtual visit: patient, provider  I discussed the limitations of evaluation and management by telemedicine and the availability of in person appointments. The patient expressed understanding and agreed to proceed.  HPI: Pt following up on ADHD.  Requesting a refill on Adderrall.  States is doing well.  Not having to travel for work due to [place].    Pt states that she passed out and broke her R elbow when she was sick with COVID.  She also hit her face.  Seen by the Orthopedist.  States was in a cast and sling, but recently had it removed.  States doing well now.  Unable to fully flex her R arm/bicep.  Started lifting weights again.   ROS: See pertinent positives and negatives per HPI.  Past Medical History:  Diagnosis Date  . Allergy   . Heart murmur     Past Surgical History:  Procedure Laterality Date  . ABDOMINAL HYSTERECTOMY      Family History  Problem Relation Age of Onset  . Arthritis Mother   . Cancer Mother   . Hearing loss Mother   . Cancer Father   . Depression Sister   . Asthma Brother     Current Outpatient Medications:  .  amphetamine-dextroamphetamine (ADDERALL) 10 MG tablet, Take 1 tablet (10 mg total) by mouth 2 (two) times daily for 7 days., Disp: 14 tablet, Rfl: 0 .  amphetamine-dextroamphetamine (ADDERALL) 10 MG tablet, Take 1 tablet (10 mg total) by mouth 2 (two) times daily., Disp: 60 tablet, Rfl: 0 .  amphetamine-dextroamphetamine (ADDERALL) 10 MG tablet, Take 1 tablet (10 mg total) by mouth 2 (two) times daily with a meal., Disp: 60 tablet, Rfl: 0 .  amphetamine-dextroamphetamine (ADDERALL) 10 MG tablet, Take 1 tablet (10 mg  total) by mouth 2 (two) times daily., Disp: 60 tablet, Rfl: 0 .  azithromycin (ZITHROMAX) 250 MG tablet, Take 1 tablet (250 mg total) by mouth daily. Take two tabs on day 1 and then 1 tab on days 2-5, Disp: 6 tablet, Rfl: 0 .  doxycycline (VIBRA-TABS) 100 MG tablet, Take 1 tablet (100 mg total) by mouth 2 (two) times daily., Disp: 20 tablet, Rfl: 0 .  fluticasone (FLONASE) 50 MCG/ACT nasal spray, Place 1 spray into both nostrils 2 (two) times daily as needed for allergies or rhinitis., Disp: 16 g, Rfl: 11 .  hydrOXYzine (ATARAX/VISTARIL) 25 MG tablet, Take 1 tablet (25 mg total) by mouth 3 (three) times daily as needed for anxiety., Disp: 30 tablet, Rfl: 0 .  ondansetron (ZOFRAN) 4 MG tablet, Take 1 tablet (4 mg total) by mouth every 8 (eight) hours as needed for nausea or vomiting., Disp: 20 tablet, Rfl: 0  EXAM:  VITALS per patient if applicable:  GENERAL: alert, oriented, appears well and in no acute distress  HEENT: atraumatic, conjunctiva clear, no obvious abnormalities on inspection of external nose and ears  NECK: normal movements of the head and neck  LUNGS: on inspection no signs of respiratory distress, breathing rate appears normal, no obvious gross SOB, gasping or wheezing  CV: no obvious cyanosis  MS: moves all visible extremities without noticeable abnormality  PSYCH/NEURO: pleasant and cooperative, no obvious depression or anxiety, speech  and thought processing grossly intact  ASSESSMENT AND PLAN:  Discussed the following assessment and plan:  History of fracture -healing -f/u with Ortho prn  Attention deficit hyperactivity disorder (ADHD), unspecified ADHD type - Plan: amphetamine-dextroamphetamine (ADDERALL) 10 MG tablet, amphetamine-dextroamphetamine (ADDERALL) 10 MG tablet, amphetamine-dextroamphetamine (ADDERALL) 10 MG tablet   F/u in 3 months for ADHD.   I discussed the assessment and treatment plan with the patient. The patient was provided an opportunity  to ask questions and all were answered. The patient agreed with the plan and demonstrated an understanding of the instructions.   The patient was advised to call back or seek an in-person evaluation if the symptoms worsen or if the condition fails to improve as anticipated.   [name], MD

## 2019-04-16 ENCOUNTER — Other Ambulatory Visit: Payer: Self-pay | Admitting: Family Medicine

## 2019-04-16 DIAGNOSIS — J329 Chronic sinusitis, unspecified: Secondary | ICD-10-CM

## 2019-04-22 ENCOUNTER — Telehealth: Payer: Self-pay | Admitting: Family Medicine

## 2019-04-22 NOTE — Telephone Encounter (Signed)
Spoke with pt pharmacy state that pt has enough refills for the Rx

## 2019-04-22 NOTE — Telephone Encounter (Signed)
amphetamine-dextroamphetamine (ADDERALL) 10 MG tablet   [id number] [name], [place] - [address] Phone:  [phone]  Fax:  [phone]

## 2019-05-14 ENCOUNTER — Other Ambulatory Visit: Payer: Self-pay | Admitting: Adult Health

## 2019-05-14 NOTE — Telephone Encounter (Signed)
Rx not on pt chart

## 2019-05-20 ENCOUNTER — Telehealth (INDEPENDENT_AMBULATORY_CARE_PROVIDER_SITE_OTHER): Payer: BC Managed Care – PPO | Admitting: Family Medicine

## 2019-05-20 DIAGNOSIS — R3915 Urgency of urination: Secondary | ICD-10-CM

## 2019-05-20 LAB — POC URINALSYSI DIPSTICK (AUTOMATED)
Bilirubin, UA: NEGATIVE
Blood, UA: NEGATIVE
Glucose, UA: NEGATIVE
Ketones, UA: NEGATIVE
Leukocytes, UA: NEGATIVE
Nitrite, UA: NEGATIVE
Protein, UA: POSITIVE — AB
Spec Grav, UA: 1.02 (ref 1.010–1.025)
Urobilinogen, UA: 0.2 E.U./dL
pH, UA: 6 (ref 5.0–8.0)

## 2019-05-20 NOTE — Progress Notes (Signed)
Virtual Visit via Telephone Note  I connected with Latoya Martinez on 05/20/19 at 11:00 AM EDT by telephone and verified that I am speaking with the correct person using two identifiers.   I discussed the limitations, risks, security and privacy concerns of performing an evaluation and management service by telephone and the availability of in person appointments. I also discussed with the patient that there may be a patient responsible charge related to this service. The patient expressed understanding and agreed to proceed.  Location patient: home Location provider: work or home office Participants present for the call: patient, provider Patient did not have a visit in the prior 7 days to address this/these issue(s).   History of Present Illness: Pt is a 54 yo female with pmh sig for R radial head fx, ADHD, allergies contacted for acute concern of possible UTI.  Pt states she had to hold her urine for 2 hrs last wk while waiting to give a UDS sample for a new job.  Since then pt endorses pain to the R of umbilicus that does not move and urinary frequency x 3 days.  Denies dysuria, n/v, fever, chills, vaginal d/c.  Pt notes recently started working out again. Did back exercises.  Has some R upper back pain and lower L back pain.    Pt is excited as she starts her new job later this month.   Observations/Objective: Patient sounds cheerful and well on the phone. I do not appreciate any SOB. Speech and thought processing are grossly intact. Patient reported vitals:  Assessment and Plan: Urinary urgency  -discussed possible causes including UTI, renal calculi, increased caffeine intake, constipation, vaginitis, etc. -UA with protein -increase po intake of fluids -will obtain UCx -given precautions - Plan: POCT Urinalysis Dipstick (Automated), Urine Culture   Follow Up Instructions: F/u prn  I did not refer this patient for an OV in the next 24 hours for this/these  issue(s).  I discussed the assessment and treatment plan with the patient. The patient was provided an opportunity to ask questions and all were answered. The patient agreed with the plan and demonstrated an understanding of the instructions.   The patient was advised to call back or seek an in-person evaluation if the symptoms worsen or if the condition fails to improve as anticipated.  I provided 9:20 minutes of non-face-to-face time during this encounter.   [name], MD

## 2019-05-21 LAB — URINE CULTURE
MICRO NUMBER:: 10327497
SPECIMEN QUALITY:: ADEQUATE

## 2019-05-23 ENCOUNTER — Telehealth: Payer: Self-pay | Admitting: Family Medicine

## 2019-05-23 NOTE — Telephone Encounter (Signed)
Pt is calling in for refill on Addrell that is due for refill on Saturday 05/25/2019.  [facility]:  [facility] [address]

## 2019-05-23 NOTE — Telephone Encounter (Signed)
Spoke with pt pharmacy regarding pt adderall refill state that pt  has one refill left, advised pharmacy to fill rx for pt to pick up, Left pt a detailed message to pick up Rx from her pharmacy.

## 2019-06-10 ENCOUNTER — Other Ambulatory Visit: Payer: Self-pay | Admitting: Adult Health

## 2019-06-19 ENCOUNTER — Other Ambulatory Visit: Payer: Self-pay | Admitting: Family Medicine

## 2019-06-19 DIAGNOSIS — Z1231 Encounter for screening mammogram for malignant neoplasm of breast: Secondary | ICD-10-CM

## 2019-06-20 ENCOUNTER — Telehealth: Payer: Self-pay | Admitting: Family Medicine

## 2019-06-20 NOTE — Telephone Encounter (Signed)
Pt needs appt q 3 months for refills.

## 2019-06-20 NOTE — Telephone Encounter (Signed)
Pt  ADHD office visit was on 03/27/2019 and last refill was done on 03/27/2019 for 3 months supply, please advise if pt need a visit for refill

## 2019-06-20 NOTE — Telephone Encounter (Signed)
Medication:Adderall  Pharmacy: [facility] [address] [facility]

## 2019-06-21 ENCOUNTER — Other Ambulatory Visit: Payer: Self-pay | Admitting: Adult Health

## 2019-06-21 NOTE — Telephone Encounter (Signed)
Left detailed message for pt to call office and schedule a MyChart Video visit with Dr Salomon Fick for medication (Adderall) refill

## 2019-06-24 ENCOUNTER — Encounter: Payer: Self-pay | Admitting: Family Medicine

## 2019-06-24 ENCOUNTER — Telehealth: Payer: Self-pay | Admitting: Family Medicine

## 2019-06-24 DIAGNOSIS — J3089 Other allergic rhinitis: Secondary | ICD-10-CM

## 2019-06-24 MED ORDER — AZELASTINE HCL 0.1 % NA SOLN: 1.0000 | Freq: Two times a day (BID) | NASAL | 5 refills | Status: DC

## 2019-06-24 NOTE — Progress Notes (Signed)
Pt did not realize she had a refill on Adderall.  States the pharmacy text her about the refill being ready.  Also new insurance starts next month for new job.  Pt wasn't sure if she could cancel this appt.    Ok with canceling the appt.  Pt to f/u next month for ADHD and refills on Adderall.  Will refill pt's azelastine nasal spray since on the phone with her.  Will need in person visit soon for UDS.  Abbe Amsterdam, MD

## 2019-06-25 NOTE — Telephone Encounter (Signed)
Pt had a MyChart video visit with Dr Salomon Fick

## 2019-07-19 ENCOUNTER — Telehealth: Payer: Self-pay | Admitting: Family Medicine

## 2019-07-19 NOTE — Telephone Encounter (Signed)
Pt LOV F/U on ADHD was 03/27/2019 and last refill was done same day, please advise

## 2019-07-19 NOTE — Telephone Encounter (Signed)
amphetamine-dextroamphetamine (ADDERALL) 10 MG tablet  [id number] [name], [place] - [address] Phone:  [phone]  Fax:  [phone]

## 2019-07-22 NOTE — Telephone Encounter (Signed)
Please schedule pt for a f/u appt for this.

## 2019-07-22 NOTE — Telephone Encounter (Signed)
Called pt to schedule her for her f/u med refill, pt kept talking over me stating that she is very busy and works for a very big [employer] and that she should not be going through what she has been through getting refills, Pt is scheduled for Telephone visit on Wed

## 2019-07-24 ENCOUNTER — Encounter: Payer: Self-pay | Admitting: Family Medicine

## 2019-07-24 ENCOUNTER — Other Ambulatory Visit: Payer: Self-pay

## 2019-07-24 ENCOUNTER — Telehealth (INDEPENDENT_AMBULATORY_CARE_PROVIDER_SITE_OTHER): Payer: Self-pay | Admitting: Family Medicine

## 2019-07-24 DIAGNOSIS — F909 Attention-deficit hyperactivity disorder, unspecified type: Secondary | ICD-10-CM

## 2019-07-24 MED ORDER — AMPHETAMINE-DEXTROAMPHETAMINE 10 MG PO TABS: 10.0000 mg | ORAL_TABLET | Freq: Two times a day (BID) | ORAL | 0 refills | Status: DC

## 2019-07-24 NOTE — Progress Notes (Signed)
Patient called for appointment however she did not answer the phone. VM left. Will try again in a few minutes.

## 2019-07-24 NOTE — Progress Notes (Signed)
Virtual Visit via Telephone Note  I connected with Latoya Martinez on 07/24/19 at  1:30 PM EDT by telephone and verified that I am speaking with the correct person using two identifiers.   I discussed the limitations, risks, security and privacy concerns of performing an evaluation and management service by telephone and the availability of in person appointments. I also discussed with the patient that there may be a patient responsible charge related to this service. The patient expressed understanding and agreed to proceed.  Location patient: home Location provider: work or home office Participants present for the call: patient, provider Patient did not have a visit in the prior 7 days to address this/these issue(s).   History of Present Illness: Pt states needs a refill on Adderall.  Pt upset she had to wait for refill after calling at 4:56 pm on Friday 07/19/19.  Pt notes increased stress at work.  Pt frustrated with the process of calling in and dealing with the pharmacy to get meds refilled. Pt's new insurance starts next wk.  Pt has a work trip to [place] that she needs to leave for, but had to wait for this call to get her med refilled.     Observations/Objective: Patient sounds cheerful and well on the phone. I do not appreciate any SOB. Speech and thought processing are grossly intact. Patient reported vitals:  Assessment and Plan: Attention deficit hyperactivity disorder (ADHD), unspecified ADHD type  -PDMP reviewed -pt reminded of every 3 month visits. - Plan: amphetamine-dextroamphetamine (ADDERALL) 10 MG tablet, amphetamine-dextroamphetamine (ADDERALL) 10 MG tablet, amphetamine-dextroamphetamine (ADDERALL) 10 MG tablet   Follow Up Instructions:   f/u in 3 months  I did not refer this patient for an OV in the next 24 hours for this/these issue(s).  I discussed the assessment and treatment plan with the patient. The patient was provided an opportunity to ask  questions and all were answered. The patient agreed with the plan and demonstrated an understanding of the instructions.   The patient was advised to call back or seek an in-person evaluation if the symptoms worsen or if the condition fails to improve as anticipated.  I provided 10 minutes of non-face-to-face time during this encounter.   [name], MD

## 2019-08-20 ENCOUNTER — Telehealth: Payer: Self-pay | Admitting: Family Medicine

## 2019-08-20 NOTE — Telephone Encounter (Addendum)
Pt request a call back about this medication [phone]  Medication Refill:  ADDERALL    Pharmacy:  [id number] [name], [place] - [address] Phone:  [phone]  Fax:  [phone]

## 2019-08-20 NOTE — Telephone Encounter (Signed)
Pt has more refills at her pharmacy

## 2019-09-19 ENCOUNTER — Telehealth: Payer: Self-pay | Admitting: Family Medicine

## 2019-09-19 NOTE — Telephone Encounter (Signed)
Pt call and stated he need a refill on amphetamine-dextroamphetamine (ADDERALL) 10 MG tablet sent to  [id number] - [place], [place] - [address] Phone:  [phone]  Fax:  [phone]    And pt want a call to let her know when it is ready .

## 2019-09-20 NOTE — Telephone Encounter (Signed)
Spoke with pt pharmacy state that pt called them for her Adderall refill, state that pt is aware to pick up Rx

## 2019-10-22 ENCOUNTER — Telehealth: Payer: Self-pay | Admitting: Family Medicine

## 2019-10-22 NOTE — Telephone Encounter (Signed)
Pt called to say she needs her medication   amphetamine-dextroamphetamine (ADDERALL) 10 MG tablet   Refilled   [id number] [name], [place] - [address]  [address] [place], [place] [address]  Phone:  [phone] Fax:  [phone]   Please advise

## 2019-10-24 ENCOUNTER — Telehealth: Payer: Self-pay | Admitting: Family Medicine

## 2019-10-24 NOTE — Telephone Encounter (Signed)
Pt is calling stating that her ADDERALL is out and would like to see if she can get it today since it was suppose to be refilled today.  Pt is aware that we ask for 24-72 hours for refills and that the previous msg has been sent back to the provider.

## 2019-10-25 NOTE — Telephone Encounter (Signed)
Patient is scheduled for a Telephone Visit with Dr. Salomon Fick on Monday 10/28/2019 at 3 PM

## 2019-10-25 NOTE — Telephone Encounter (Signed)
Patient is scheduled for a Telephone Visit with Dr. Banks on Monday 10/28/2019 at 3 PM 

## 2019-10-25 NOTE — Telephone Encounter (Signed)
Appt needed q 3 months.

## 2019-10-28 ENCOUNTER — Other Ambulatory Visit: Payer: Self-pay

## 2019-10-28 ENCOUNTER — Telehealth (INDEPENDENT_AMBULATORY_CARE_PROVIDER_SITE_OTHER): Payer: No Typology Code available for payment source | Admitting: Family Medicine

## 2019-10-28 ENCOUNTER — Encounter: Payer: Self-pay | Admitting: Family Medicine

## 2019-10-28 DIAGNOSIS — F909 Attention-deficit hyperactivity disorder, unspecified type: Secondary | ICD-10-CM | POA: Diagnosis not present

## 2019-10-28 DIAGNOSIS — J302 Other seasonal allergic rhinitis: Secondary | ICD-10-CM | POA: Diagnosis not present

## 2019-10-28 MED ORDER — AMPHETAMINE-DEXTROAMPHETAMINE 10 MG PO TABS: 10.0000 mg | ORAL_TABLET | Freq: Two times a day (BID) | ORAL | 0 refills | Status: DC

## 2019-10-28 MED ORDER — FLUTICASONE PROPIONATE 50 MCG/ACT NA SUSP: NASAL | 5 refills | Status: DC

## 2019-10-28 NOTE — Progress Notes (Signed)
Virtual Visit via Telephone Note  I connected with Latoya Martinez on 10/28/19 at  3:00 PM EDT by telephone and verified that I am speaking with the correct person using two identifiers.   I discussed the limitations, risks, security and privacy concerns of performing an evaluation and management service by telephone and the availability of in person appointments. I also discussed with the patient that there may be a patient responsible charge related to this service. The patient expressed understanding and agreed to proceed.  Location patient: home Location provider: work or home office Participants present for the call: patient, provider Patient did not have a visit in the prior 7 days to address this/these issue(s).   History of Present Illness: Pt is a 54 yo female with pmh sig for ADHD and allergies.  Pt seen for f/u on ADHD.  States she is doing well.  Pt staying busy with work.  Recently took a 1.5 wk vacation seeing family.  Pt denies difficulty with sleep, decreased energy, decreased mood, changes in weight.  Patient states she may have gained some weight.  Patient notes good concentration at work on Adderall 10 mg twice daily.  Pt states she thought she called in early enough for the refill but did not realize she needs to make an appointment.  Pt notes her allergies are improving.  She is taking Azelastin nasal spray and Flonase.  Patient needs refill on Flonase.   Observations/Objective: Patient sounds cheerful and well on the phone. I do not appreciate any SOB. Speech and thought processing are grossly intact. Patient reported vitals:  Assessment and Plan: Attention deficit hyperactivity disorder (ADHD), unspecified ADHD type  -Stable -Continue Adderall 10 mg twice daily -PDMP reviewed -We will try to have patient follow-up in person for next visit.  Reminded needs to be seen every 3 months for refills on Adderall. -Prescriptions for 3 months sent to pharmacy. -  Plan: amphetamine-dextroamphetamine (ADDERALL) 10 MG tablet, amphetamine-dextroamphetamine (ADDERALL) 10 MG tablet, amphetamine-dextroamphetamine (ADDERALL) 10 MG tablet  Seasonal allergies -Continue azelastine and Flonase -Follow-up with allergy. - Plan: fluticasone (FLONASE) 50 MCG/ACT nasal spray    Follow Up Instructions: In 3 months.   99441 5-10 99442 11-20 9443 21-30 I did not refer this patient for an OV in the next 24 hours for this/these issue(s).  I discussed the assessment and treatment plan with the patient. The patient was provided an opportunity to ask questions and all were answered. The patient agreed with the plan and demonstrated an understanding of the instructions.   The patient was advised to call back or seek an in-person evaluation if the symptoms worsen or if the condition fails to improve as anticipated.  I provided 6 minutes of non-face-to-face time during this encounter.   [name], MD

## 2019-11-19 ENCOUNTER — Other Ambulatory Visit: Payer: Self-pay | Admitting: Family Medicine

## 2019-11-19 DIAGNOSIS — J3089 Other allergic rhinitis: Secondary | ICD-10-CM

## 2019-11-25 ENCOUNTER — Telehealth: Payer: Self-pay | Admitting: Family Medicine

## 2019-11-25 NOTE — Telephone Encounter (Signed)
pt requesting  a refill on her medication  amphetamine-dextroamphetamine (ADDERALL) 10 MG tablet [id number] [name], [place] - [address]  Phone:  [phone] Fax:  [phone]

## 2019-11-28 NOTE — Telephone Encounter (Signed)
Pt needs to contact her pharmacy.  Pt last seen in Sept for ADHD at which time prescriptions for the next 3 months were sent to the pharmacy.  Pt's next f/u visit is in December 2021.

## 2019-12-02 NOTE — Telephone Encounter (Signed)
Spoke with pt pharmacy confirmed that pt has picked up her Rx for Adderall from her pharmacy

## 2019-12-19 ENCOUNTER — Other Ambulatory Visit: Payer: Self-pay | Admitting: Family Medicine

## 2019-12-25 ENCOUNTER — Telehealth: Payer: Self-pay | Admitting: Family Medicine

## 2019-12-25 NOTE — Telephone Encounter (Signed)
Spoke with pt pharmacy state that pt has 1 refill for adderall and that its not due for refill until 12/28/2019, sent a message to pt through MyChart and notified pt to call her pharmacy for the refill.

## 2019-12-25 NOTE — Telephone Encounter (Signed)
Patient is calling and requesting a refill for amphetamine-dextroamphetamine (ADDERALL) 10 MG tablet [facility] to [id number] [name], [place] - [address]  [address] [place], [place] [address]  Phone:  [phone] Fax:  [phone], please advise. CB is [phone]

## 2019-12-26 NOTE — Telephone Encounter (Signed)
Last OV: 10/28/19 Last refill 11/27/19 #30 mL/ 1 Next OV not scheduled

## 2020-01-23 ENCOUNTER — Telehealth (INDEPENDENT_AMBULATORY_CARE_PROVIDER_SITE_OTHER): Payer: No Typology Code available for payment source | Admitting: Family Medicine

## 2020-01-23 ENCOUNTER — Encounter: Payer: Self-pay | Admitting: Family Medicine

## 2020-01-23 ENCOUNTER — Other Ambulatory Visit: Payer: Self-pay

## 2020-01-23 DIAGNOSIS — F909 Attention-deficit hyperactivity disorder, unspecified type: Secondary | ICD-10-CM | POA: Diagnosis not present

## 2020-01-23 DIAGNOSIS — Z7184 Encounter for health counseling related to travel: Secondary | ICD-10-CM

## 2020-01-23 DIAGNOSIS — J302 Other seasonal allergic rhinitis: Secondary | ICD-10-CM | POA: Diagnosis not present

## 2020-01-23 MED ORDER — FLUTICASONE PROPIONATE 50 MCG/ACT NA SUSP: NASAL | 5 refills | Status: DC

## 2020-01-23 MED ORDER — AMPHETAMINE-DEXTROAMPHETAMINE 10 MG PO TABS: 10.0000 mg | ORAL_TABLET | Freq: Two times a day (BID) | ORAL | 0 refills | Status: DC

## 2020-01-23 NOTE — Progress Notes (Signed)
Virtual Visit via Telephone Note  I connected with Latoya Martinez on 01/23/20 at 11:30 AM EST by telephone and verified that I am speaking with the correct person using two identifiers.   I discussed the limitations, risks, security and privacy concerns of performing an evaluation and management service by telephone and the availability of in person appointments. I also discussed with the patient that there may be a patient responsible charge related to this service. The patient expressed understanding and agreed to proceed.  Location patient: home Location provider: work or home office Participants present for the call: patient, provider Patient did not have a visit in the prior 7 days to address this/these issue(s).   History of Present Illness: Pt needs refills on adderall and flonase.  Pt staying busy with work.  She will be travelling next week.  Going to [place]. Latoya Martinez.  Has to get a COVID test 3 days prior to entry.  Inquires if will need a test to re-enter the [place].  Sleep, energy, appetite, and concentration good on Adderall 20 mg BID.  Weight stable.   Observations/Objective: Patient sounds cheerful and well on the phone. I do not appreciate any SOB. Speech and thought processing are grossly intact. Patient reported vitals:  Assessment and Plan: Attention deficit hyperactivity disorder (ADHD), unspecified ADHD type -stable -PDMP reviewed -will have pt come into office for next f/u in 3 months. - Plan: amphetamine-dextroamphetamine (ADDERALL) 10 MG tablet, amphetamine-dextroamphetamine (ADDERALL) 10 MG tablet, amphetamine-dextroamphetamine (ADDERALL) 10 MG tablet  Seasonal allergies  - Plan: fluticasone (FLONASE) 50 MCG/ACT nasal spray  Travel advice encounter -CDC and state dept websites reviewed.   -a negative rapid or PCR COVID-19 test required for entry into [place] [facility] -no requirement listed for testing on return trip to [place].   -if new info seen will update pt.    Follow Up Instructions: In 3 months in person for CPE and ADHD f/u.   99441 5-10 99442 11-20 9443 21-30 I did not refer this patient for an OV in the next 24 hours for this/these issue(s).  I discussed the assessment and treatment plan with the patient. The patient was provided an opportunity to ask questions and all were answered. The patient agreed with the plan and demonstrated an understanding of the instructions.   The patient was advised to call back or seek an in-person evaluation if the symptoms worsen or if the condition fails to improve as anticipated.  I provided 20 minutes of non-face-to-face time during this encounter.   [name], MD

## 2020-01-27 ENCOUNTER — Other Ambulatory Visit: Payer: No Typology Code available for payment source

## 2020-01-27 DIAGNOSIS — Z20822 Contact with and (suspected) exposure to covid-19: Secondary | ICD-10-CM

## 2020-01-28 LAB — NOVEL CORONAVIRUS, NAA: SARS-CoV-2, NAA: NOT DETECTED

## 2020-01-28 LAB — SARS-COV-2, NAA 2 DAY TAT

## 2020-02-20 ENCOUNTER — Telehealth: Payer: Self-pay | Admitting: Family Medicine

## 2020-02-20 NOTE — Telephone Encounter (Signed)
Pt need call and stated she need a refill on amphetamine-dextroamphetamine (ADDERALL) 10 MG tablet sent to  [id number] [name], [place] - [address] Phone:  [phone]  Fax:  [phone]

## 2020-02-21 NOTE — Telephone Encounter (Signed)
Spoke with pt pharmacy state that pt has enough refills left for her adderall.

## 2020-03-23 ENCOUNTER — Telehealth: Payer: Self-pay | Admitting: Family Medicine

## 2020-03-23 NOTE — Telephone Encounter (Signed)
Pt call and stated she need a refill on amphetamine-dextroamphetamine (ADDERALL) 10 MG tablet sent to  °[id number] [place],  - [address] Phone:  [phone]  °Fax:  [phone]  °  ° °

## 2020-03-23 NOTE — Telephone Encounter (Signed)
Spoke with pt pharmacy state that pt refill was ready for pt, state that they send out text notification to pt

## 2020-04-16 ENCOUNTER — Encounter: Payer: Self-pay | Admitting: Family Medicine

## 2020-04-16 ENCOUNTER — Ambulatory Visit: Payer: No Typology Code available for payment source | Admitting: Family Medicine

## 2020-04-16 ENCOUNTER — Other Ambulatory Visit: Payer: Self-pay

## 2020-04-16 VITALS — BP 115/80 | HR 102 | Temp 98.4°F | Wt 129.0 lb

## 2020-04-16 DIAGNOSIS — F909 Attention-deficit hyperactivity disorder, unspecified type: Secondary | ICD-10-CM

## 2020-04-16 DIAGNOSIS — Z1211 Encounter for screening for malignant neoplasm of colon: Secondary | ICD-10-CM | POA: Diagnosis not present

## 2020-04-16 MED ORDER — AMPHETAMINE-DEXTROAMPHETAMINE 10 MG PO TABS: 10.0000 mg | ORAL_TABLET | Freq: Two times a day (BID) | ORAL | 0 refills | Status: DC

## 2020-04-16 NOTE — Patient Instructions (Signed)
https://www.uspreventiveservicestaskforce.org/uspstf/recommendation/lung-cancer-screening">  Cancer Screening for Women A cancer screening is a test or exam that checks for cancer. Your health care provider will recommend specific cancer screenings based on your age, medical history (including risk factors), and family history of cancer. Work with your health care provider to create a cancer screening schedule that protects your health. Who should have screening? All women should be considered for screening of certain cancers, including breast cancer, cervical cancer, colorectal cancer, and skin cancer. Your health care provider may recommend screenings for other types of cancer if:  You had cancer before.  You have a family member with cancer.  You have abnormal genes that could increase the risk of cancer.  You have risk factors for certain cancers, such as current or past use of tobacco products, or being overweight. When you should be screened for cancer depends on:  Your age.  Your medical history and your family's medical history.  Certain lifestyle factors, such as smoking or other use of tobacco products.  Environmental exposure, such as to asbestos. How is screening done? Breast cancer Breast cancer screening is done with a test that takes images of breast tissue (mammogram). Here are some screening guidelines for women at average risk:  When you are age 22-44, you will be given the choice to start having mammograms.  When you are age 29-54, you should have a mammogram every year.  You may start having mammograms before age 67 if you have risk factors for breast cancer, such as having an immediate family member with breast cancer.  At age 34 or older, you should have a mammogram every 1-2 years for as long as you are in good health and have a life expectancy of 10 years or longer.  It is important to know what your breasts look and feel like so you can report any changes to  your health care provider.   Cervical cancer  All women at average risk should be considered for screening for cervical cancer starting no later than age 74 and continuing until age 56. Screening should not begin earlier than age 65. You will have tests every 3-5 years, depending on your results and the type of screening test. Talk with your health care provider about which screening test is right for you and how often you should be screened. ? Cervical cancer screening is done with an HPV (human papillomavirus) test to identify the virus that causes cervical cancer. To perform the test, a health care provider takes a swab of cells from yourcervix during a pelvic exam. This test may be performed along with a Pap test. This testchecks for abnormalities in the lowest part of the uterus (cervix). ? If you have had the HPV vaccine, you will still be screened for cervical cancer and follow normal screening recommendations. You do not need to be screened for cervical cancer if any of the following apply to you:  You are older than age 81 and you have had normal screening tests in the past 10 years with no serious cervical precancer or cancer in the last 25 years.  Your cervix and uterus have been removed, and you have never had cervical cancer or abnormal cells that could become cancer (precancerous cells). Colorectal cancer All adults at average risk should be considered for screening for colorectal cancer starting at age 41 and continuing until age 39. Your health care provider may recommend screening at age 40 if you are at higher risk due to family history of  colon or rectal cancer or other risk factors. You will have tests every 1-10 years, depending on your results and the type of screening test. Talk with your health care provider about which screening test is right for you and how often you should be screened. Colorectal cancer screening looks for cancer or for growths called polyps that often form  before cancer starts. Tests to look for cancer or polyps include:  Colonoscopy or flexible sigmoidoscopy. For these procedures, a flexible tube with a small camera is inserted into the rectum.  CT colonography. This test uses X-rays and a contrast dye to check the colon for polyps. If a polyp is found, you may need to have a colonoscopy so the polyp can be located and removed. Tests to look for cancer in the stool (feces) include:  Guaiac-based fecal occult blood test (FOBT). This test can find blood in stool. It can be done at home with a kit.  Fecal immunochemical test (FIT). This test can find blood in stool. For this test, you will need to collect stool samples at home.  Stool DNA test. This test looks for blood in stool and any changes in DNA that can lead to colon cancer. For this test, you will need to collect a stool sample at home and send it to a lab. Endometrial cancer There is no standard screening test for endometrial cancer, and women at average risk do not routinely need to have this screening. Talk with your health care provider about whether screening is right for you. If it is, this screening is performed through:  Endometrial tissue biopsy. This tests a sample of tissue taken from the lining of the uterus.  Vaginal ultrasound. If you are at increased risk for endometrial cancer, you may need to have these tests more often than normal. You are at increased risk if:  You have a family history of ovarian, uterine, or colon cancer.  You are taking tamoxifen, a medicine used to treat breast cancer.  You have certain types of colon cancer. If you have reached menopause, it is especially important to talk with your health care provider about any vaginal bleeding or spotting. Screening for endometrial cancer is not recommended for women who do not have symptoms of the cancer, such as vaginal bleeding. Lung cancer Lung cancer screening is done with a CT scan that looks for  abnormal changes in the lungs. Discuss lung cancer screening with your health care provider if you are 29-67 years old and if any of the following apply to you:  You currently smoke.  You used to smoke heavily.  You have a smoking history of 1 pack of cigarettes a day for 20 years or 2 packs a day for 10 years.  You have quit smoking within the past 15 years. You may need to be screened every year if you smoke heavily or if you used to smoke. Skin cancer Skin cancer screening is done by checking the skin for unusual moles or spots and any changes in existing moles. Your health care provider should check your skin for signs of skin cancer at every physical exam. You should check your skin every month and tell your health care provider right away if anything looks unusual. Women with a higher-than-normal risk for skin cancer may want to see a skin specialist (dermatologist) for an annual body check. What are the benefits of screening? Cancer screening is done to look for cancer in the very early stages, before it spreads  and becomes harder to treat and before you would start to notice symptoms. Finding cancer early improves the chances of successful treatment. It may save your life. Where to find more information  American Cancer Society: www.cancer.org  Centers for Disease Control and Prevention: http://www.wolf.info/  National Cancer Institute: www.cancer.gov  U.S. Department of Health and Human Services: [place].tn Contact a health care provider if: You have concerns about any signs or symptoms of cancer. These may include:  Skin problems. You may have: ? Moles of an unusual shape or color. ? Changes in existing moles. ? A sore on your skin that does not heal.  Tiredness (fatigue) that does not go away.  Losing weight without trying.  Blood in your urine or stool.  Problems with coughing or breathing. These may include: ? Coughing or trouble breathing that does not go  away. ? Coughing up blood.  Lumps or other changes in your breasts.  Vaginal bleeding, spotting, or changes in your periods.  Frequent pain or cramping in your abdomen. Summary  Your health care provider will recommend specific cancer screenings based on your age, medical history, and family history of cancer.  Work with your health care provider to create a cancer screening schedule that protects your health.  Finding cancer early improves the chances of successful treatment. It may save your life.  Contact a health care provider if you have concerns about any signs or symptoms of cancer. This information is not intended to replace advice given to you by your health care provider. Make sure you discuss any questions you have with your health care provider. Document Revised: 06/24/2019 Document Reviewed: 12/28/2018 Elsevier Patient Education  2021 [place] With Attention Deficit Hyperactivity Disorder If you have been diagnosed with attention deficit hyperactivity disorder (ADHD), you may be relieved that you now know why you have felt or behaved a certain way. Still, you may feel overwhelmed about the treatment ahead. You may also wonder how to get the support you need and how to deal with the condition day-to-day. With treatment and support, you can live with ADHD and manage your symptoms. How to manage lifestyle changes Managing stress Stress is your body's reaction to life changes and events, both good and bad. To cope with the stress of an ADHD diagnosis, it may help to:  Learn more about ADHD.  Exercise regularly. Even a short daily walk can lower stress levels.  Participate in training or education programs (including social skills training classes) that teach you to deal with symptoms.   Medicines Your health care provider may suggest certain medicines if he or she feels that they will help to improve your condition. Stimulant medicines are usually prescribed to  treat ADHD, and therapy may also be prescribed. It is important to:  Avoid using alcohol and other substances that may prevent your medicines from working properly (may interact).  Talk with your pharmacist or health care provider about all the medicines that you take, their possible side effects, and what medicines are safe to take together.  Make it your goal to take part in all treatment decisions (shared decision-making). Ask about possible side effects of medicines that your health care provider recommends, and tell him or her how you feel about having those side effects. It is best if shared decision-making with your health care provider is part of your total treatment plan. Relationships To strengthen your relationships with family members while treating your condition, consider taking part in family therapy. You  might also attend self-help groups alone or with a loved one. Be honest about how your symptoms affect your relationships. Make an effort to communicate respectfully instead of fighting, and find ways to show others that you care. Psychotherapy may be useful in helping you cope with how ADHD affects your relationships. How to recognize changes in your condition The following signs may mean that your treatment is working well and your condition is improving:  Consistently being on time for appointments.  Being more organized at home and work.  Other people noticing improvements in your behavior.  Achieving goals that you set for yourself.  Thinking more clearly. The following signs may mean that your treatment is not working very well:  Feeling impatience or more confusion.  Missing, forgetting, or being late for appointments.  An increasing sense of disorganization and messiness.  More difficulty in reaching goals that you set for yourself.  Loved ones becoming angry or frustrated with you. Follow these instructions at home:  Take over-the-counter and prescription  medicines only as told by your health care provider. Check with your health care provider before taking any new medicines.  Create structure and an organized atmosphere at home. For example: ? Make a list of tasks, then rank them from most important to least important. Work on one task at a time until your listed tasks are done. ? Make a daily schedule and follow it consistently every day. ? Use an appointment calendar, and check it 2 or 3 times a day to keep on track. Keep it with you when you leave the house. ? Create spaces where you keep certain things, and always put things back in their places after you use them.  Keep all follow-up visits as told by your health care provider. This is important. Where to find support Talking to others  Keep emotion out of important discussions and speak in a calm, logical way.  Listen closely and patiently to your loved ones. Try to understand their point of view, and try to avoid getting defensive.  Take responsibility for the consequences of your actions.  Ask that others do not take your behaviors personally.  Aim to solve problems as they come up, and express your feelings instead of bottling them up.  Talk openly about what you need from your loved ones and how they can support you.  Consider going to family therapy sessions or having your family meet with a specialist who deals with ADHD-related behavior problems.   Finances Not all insurance plans cover mental health care, so it is important to check with your insurance carrier. If paying for co-pays or counseling services is a problem, search for a local or county mental health care center. Public mental health care services may be offered there at a low cost or no cost when you are not able to see a private health care provider. If you are taking medicine for ADHD, you may be able to get the generic form, which may be less expensive than brand-name medicine. Some makers of prescription  medicines also offer help to patients who cannot afford the medicines that they need. Questions to ask your health care provider:  What are the risks and benefits of taking medicines?  Would I benefit from therapy?  How often should I follow up with a health care provider? Contact a health care provider if:  You have side effects from your medicines, such as: ? Repeated muscle twitches, coughing, or speech outbursts. ? Sleep problems. ?  Loss of appetite. ? Depression. ? New or worsening behavior problems. ? Dizziness. ? Unusually fast heartbeat. ? Stomach pains. ? Headaches. Get help right away if:  You have a severe reaction to a medicine.  Your behavior suddenly gets worse. Summary  With treatment and support, you can live with ADHD and manage your symptoms.  The medicines that are most often prescribed for ADHD are stimulants.  Consider taking part in family therapy or self-help groups with family members or friends.  When you talk with friends and family about your ADHD, be patient and communicate openly.  Take over-the-counter and prescription medicines only as told by your health care provider. Check with your health care provider before taking any new medicines. This information is not intended to replace advice given to you by your health care provider. Make sure you discuss any questions you have with your health care provider. Document Revised: 07/17/2019 Document Reviewed: 07/17/2019 Elsevier Patient Education  2021 [employer].

## 2020-04-16 NOTE — Progress Notes (Signed)
Subjective:    Patient ID: Latoya Martinez, female    DOB: 02-May-1965, 55 y.o.   MRN: [id number]  Chief Complaint  Patient presents with  . Follow-up    HPI Patient was seen today for f/u.  Patient states she has been doing well overall.  Patient stable on Adderall 10 mg twice daily.  Recently returned from the [place].  Mammogram 4 years ago.  No recent colon cancer screening, had a colonoscopy in her 30s.  Pt's mother a history of colon cancer.  Past Medical History:  Diagnosis Date  . Allergy   . Heart murmur     No Known Allergies  ROS General: Denies fever, chills, night sweats, changes in weight, changes in appetite HEENT: Denies headaches, ear pain, changes in vision, rhinorrhea, sore throat CV: Denies CP, palpitations, SOB, orthopnea Pulm: Denies SOB, cough, wheezing GI: Denies abdominal pain, nausea, vomiting, diarrhea, constipation GU: Denies dysuria, hematuria, frequency, vaginal discharge Msk: Denies muscle cramps, joint pains Neuro: Denies weakness, numbness, tingling Skin: Denies rashes, bruising Psych: Denies depression, anxiety, hallucinations    Objective:    Blood pressure 115/80, pulse (!) 102, temperature 98.4 F (36.9 C), temperature source Oral, weight 129 lb (58.5 kg), SpO2 93 %.  Gen. Pleasant, well-nourished, in no distress, normal affect   HEENT: [place]/AT, face symmetric, conjunctiva clear, no scleral icterus, PERRLA, EOMI, nares patent without drainage Lungs: no accessory muscle use, CTAB, no wheezes or rales Cardiovascular: RRR, no m/r/g, no peripheral edema Musculoskeletal: No deformities, no cyanosis or clubbing, normal tone Neuro:  A&Ox3, CN II-XII intact, normal gait Skin:  Warm, dry, intact.  Sun-damaged skin.  No lesions/ rash.   Wt Readings from Last 3 Encounters:  04/16/20 129 lb (58.5 kg)  04/13/18 136 lb (61.7 kg)  10/02/17 138 lb 2 oz (62.7 kg)    No results found for: WBC, HGB, HCT, PLT, GLUCOSE, CHOL, TRIG, HDL,  LDLDIRECT, LDLCALC, ALT, AST, NA, K, CL, CREATININE, BUN, CO2, TSH, PSA, INR, GLUF, HGBA1C, MICROALBUR  Assessment/Plan:  Screen for colon cancer -Family history of colon cancer - Plan: Ambulatory referral to Gastroenterology  Attention deficit hyperactivity disorder (ADHD), unspecified ADHD type  -Stable -Continue Adderall 10 mg twice daily -66-month supply sent to pharmacy. -PMP reviewed - Plan: amphetamine-dextroamphetamine (ADDERALL) 10 MG tablet, amphetamine-dextroamphetamine (ADDERALL) 10 MG tablet, amphetamine-dextroamphetamine (ADDERALL) 10 MG tablet  F/u in 3 months, sooner if needed  Abbe Amsterdam, MD

## 2020-05-03 IMAGING — DX DG FOOT COMPLETE 3+V*L*
3 series · 3 of 3 positions shown · non-contrast
Comparison: No recent prior.

CLINICAL DATA: Generalized left foot pain. Swelling. No known
injury.

EXAM:
LEFT FOOT - COMPLETE 3+ VIEW

[foot ap]
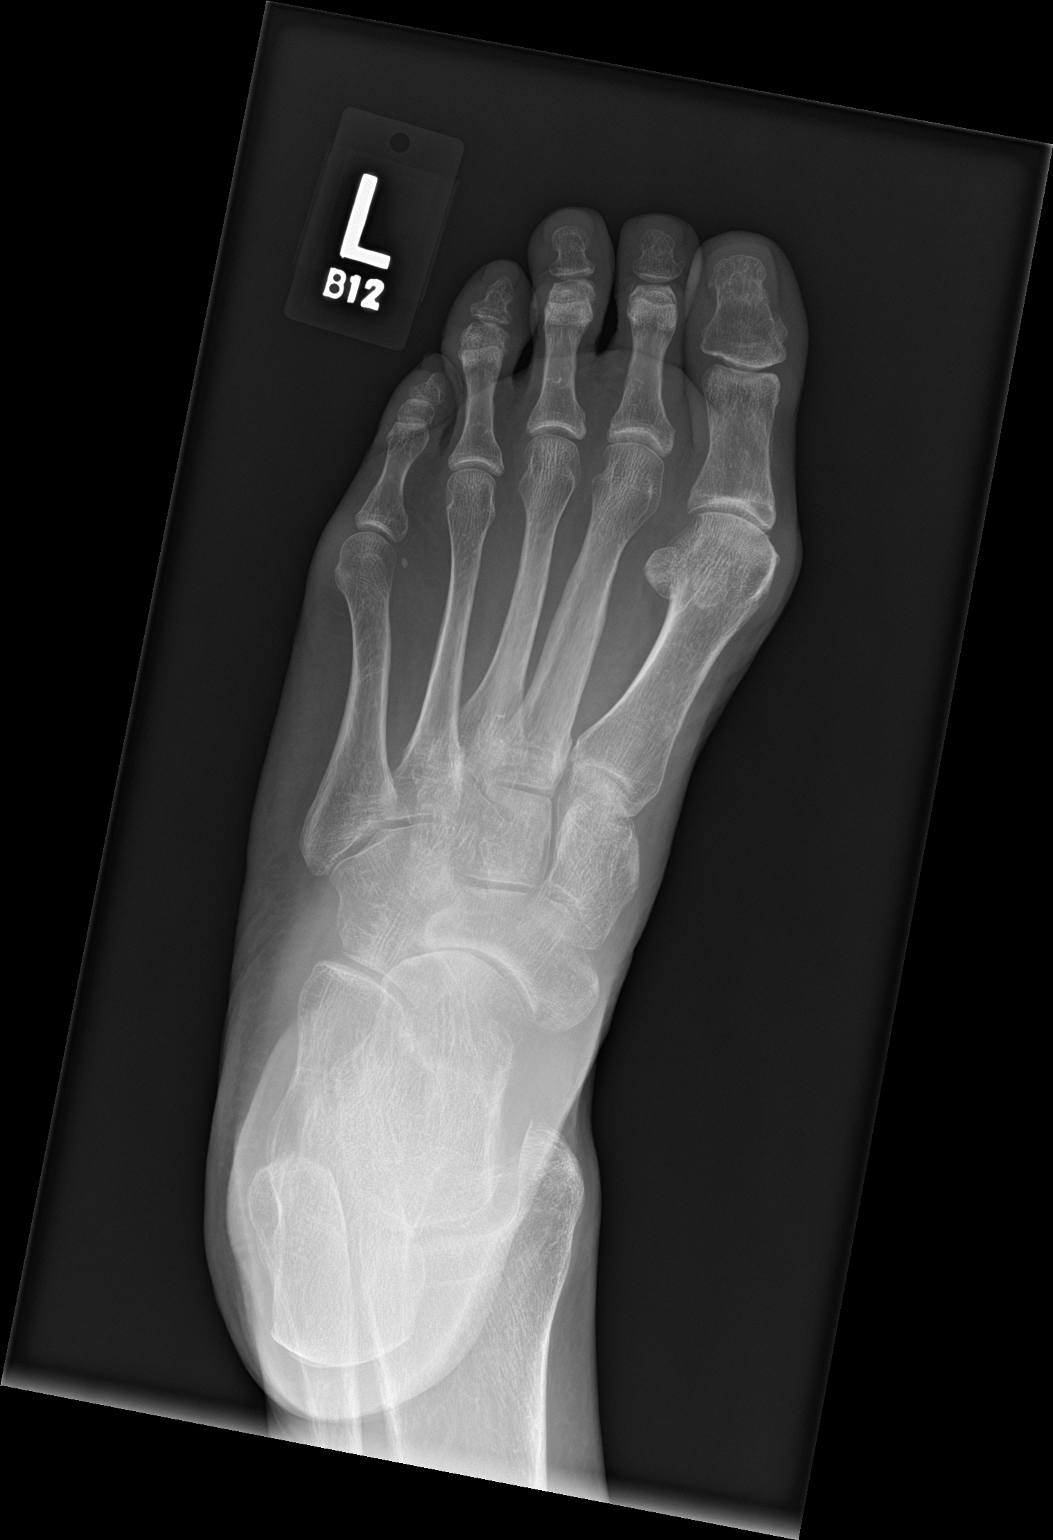

[foot obl]
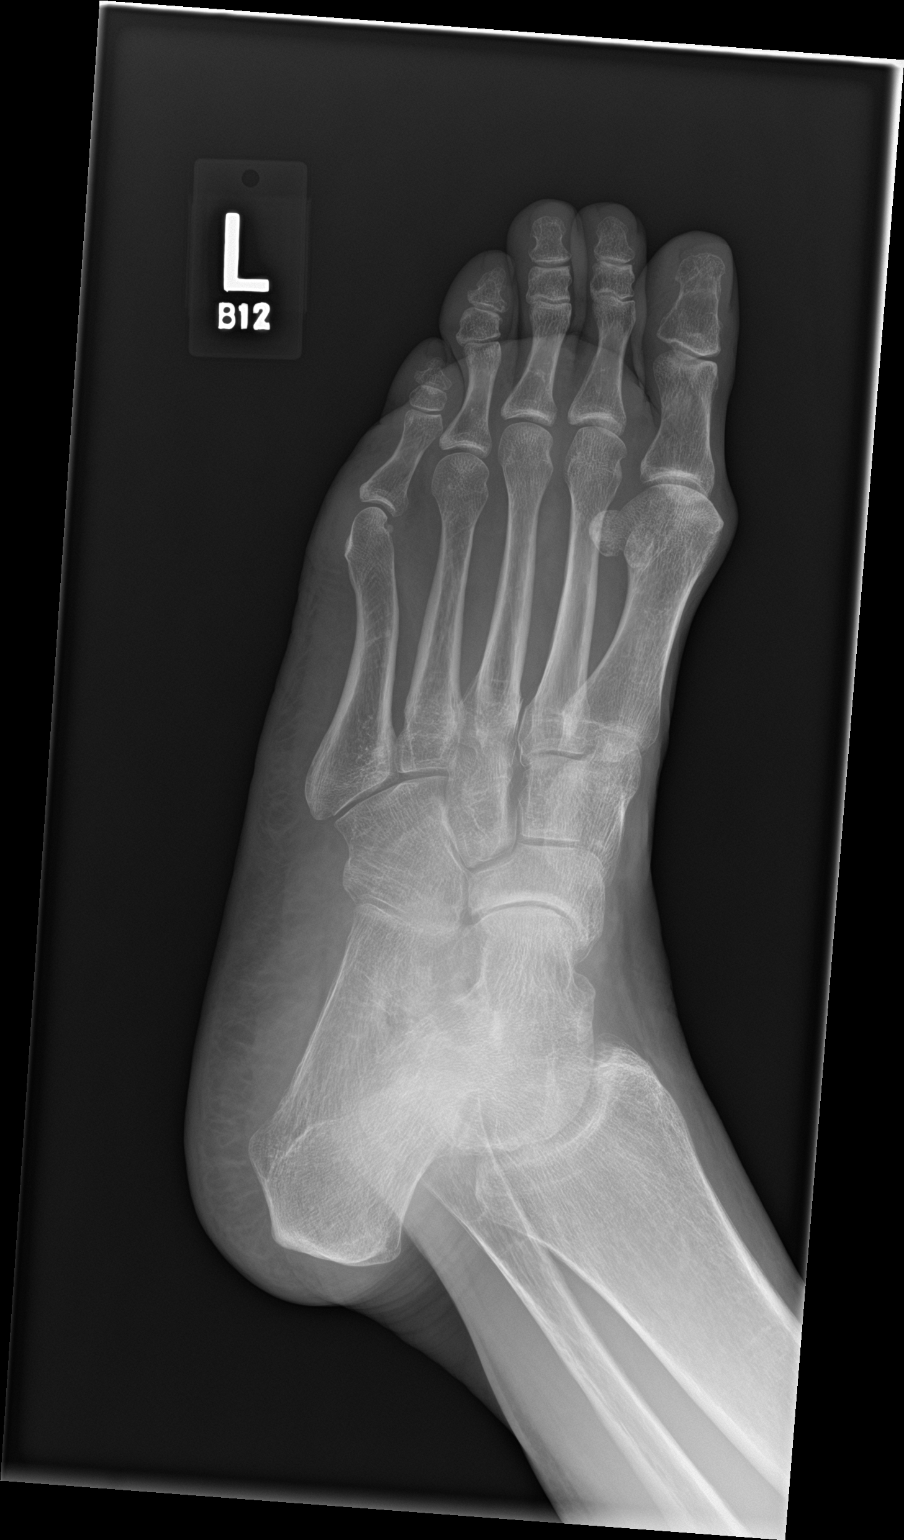

[foot lat]
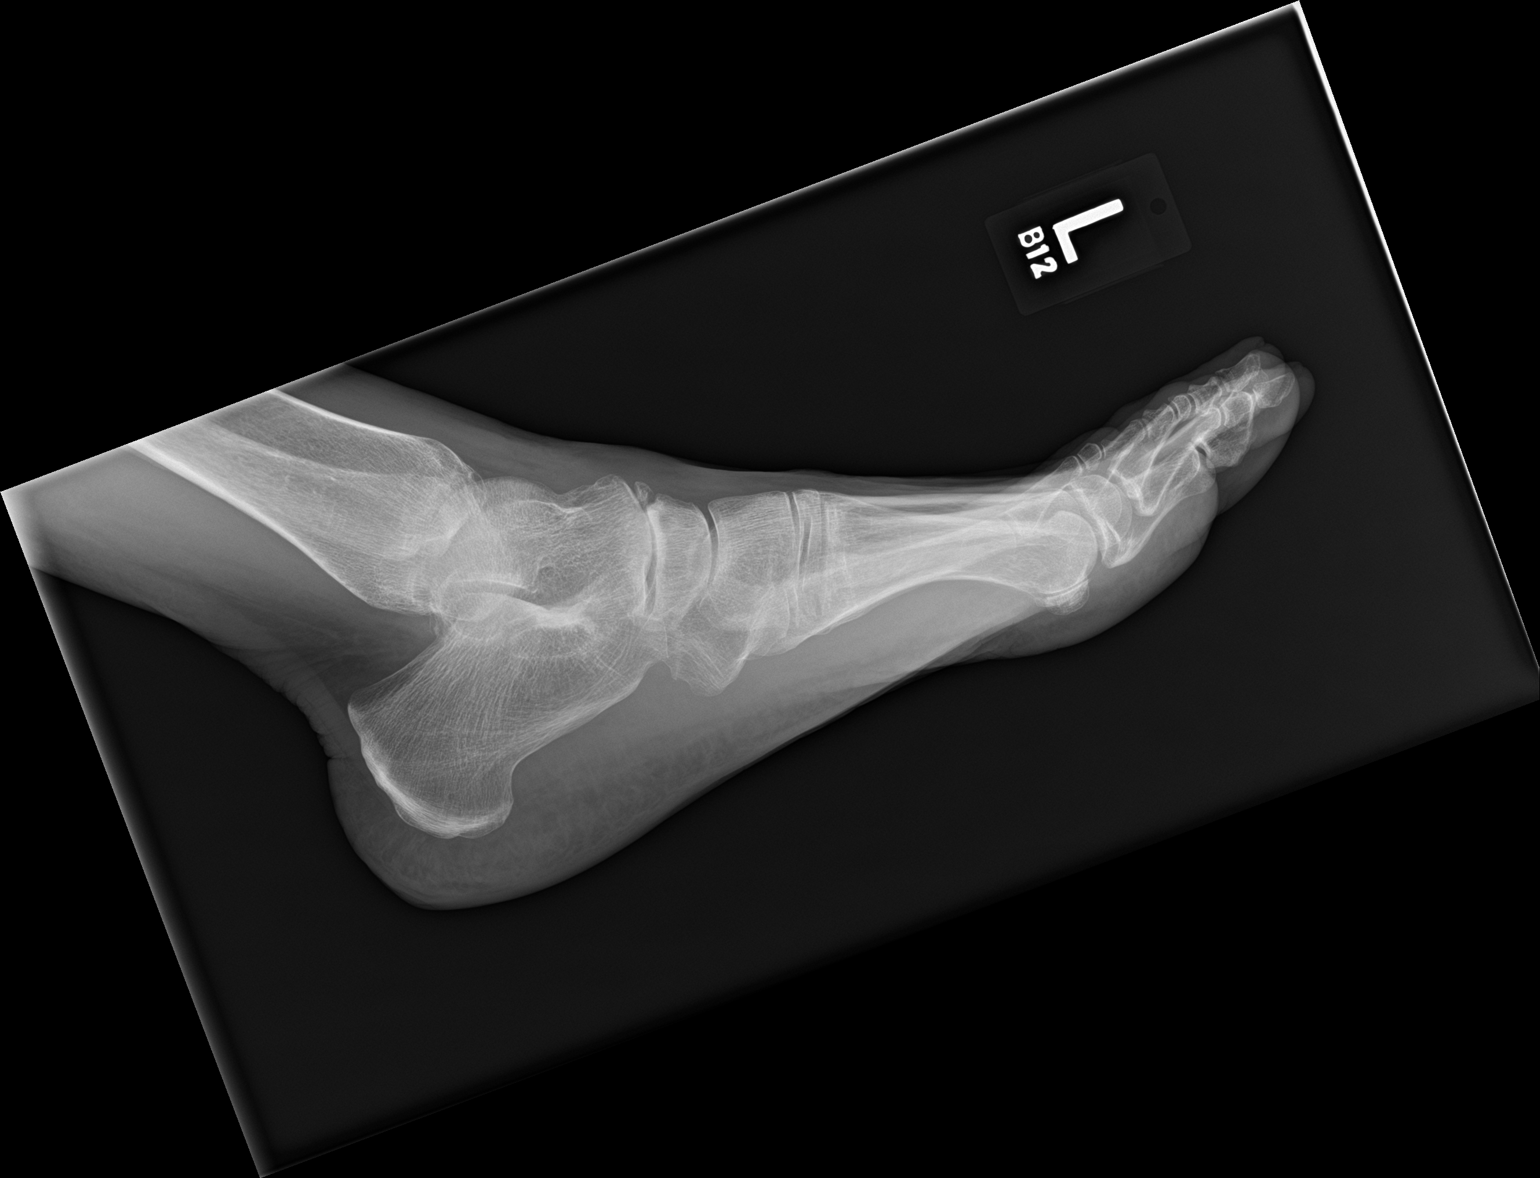

[3 of 3 positions shown; findings below may reference images not displayed]

FINDINGS: Diffuse osteopenia and degenerative change. Degenerative change most
prominent about the first MTP joint. No acute bony abnormality. No
soft tissue abnormality identified.
IMPRESSION: Diffuse osteopenia and degenerative change. Degenerative changes
most prior about the first MTP joint. No acute bony abnormality
identified. No soft tissue abnormality identified.

## 2020-05-15 ENCOUNTER — Telehealth: Payer: Self-pay | Admitting: Family Medicine

## 2020-05-15 NOTE — Telephone Encounter (Signed)
Patient is calling and requesting a refill for amphetamine-dextroamphetamine (ADDERALL) 10 to be sent to  [id number] [name], [place] - [address]  [address] [place], [place] [address]  Phone:  [phone] Fax:  [phone]

## 2020-05-15 NOTE — Telephone Encounter (Signed)
Called pharmacy, they advised refill was received and will let patient know when it is ready. Called patient, no answer, left message on vm that pharmacy will notify her when refill is ready.

## 2020-07-14 ENCOUNTER — Telehealth: Payer: Self-pay | Admitting: Family Medicine

## 2020-07-14 NOTE — Telephone Encounter (Signed)
Patient is calling is requesting a refill for amphetamine-dextroamphetamine (ADDERALL) 10 MG tablet to be sent to   [facility] [address], [place] [address] [place], [place] [address]  Phone:  [phone] Fax:  [phone]  CB is [phone]

## 2020-07-15 NOTE — Telephone Encounter (Signed)
Pt will need to set up a virtual or in person visit for refill q 3 months for Adderall.

## 2020-07-16 ENCOUNTER — Other Ambulatory Visit: Payer: Self-pay

## 2020-07-16 ENCOUNTER — Telehealth (INDEPENDENT_AMBULATORY_CARE_PROVIDER_SITE_OTHER): Payer: No Typology Code available for payment source | Admitting: Family Medicine

## 2020-07-16 ENCOUNTER — Encounter: Payer: Self-pay | Admitting: Family Medicine

## 2020-07-16 DIAGNOSIS — J302 Other seasonal allergic rhinitis: Secondary | ICD-10-CM

## 2020-07-16 DIAGNOSIS — F909 Attention-deficit hyperactivity disorder, unspecified type: Secondary | ICD-10-CM | POA: Diagnosis not present

## 2020-07-16 MED ORDER — AMPHETAMINE-DEXTROAMPHETAMINE 10 MG PO TABS: 10.0000 mg | ORAL_TABLET | Freq: Two times a day (BID) | ORAL | 0 refills | Status: DC

## 2020-07-16 MED ORDER — FLUTICASONE PROPIONATE 50 MCG/ACT NA SUSP: NASAL | 5 refills | Status: DC

## 2020-07-16 MED ORDER — AMPHETAMINE-DEXTROAMPHETAMINE 10 MG PO TABS
10.0000 mg | ORAL_TABLET | Freq: Two times a day (BID) | ORAL | 0 refills | Status: DC
Start: 2020-07-16 — End: 2020-10-08

## 2020-07-16 NOTE — Telephone Encounter (Signed)
Patient scheduled for VV today at 3 pm.

## 2020-07-16 NOTE — Progress Notes (Signed)
Virtual Visit via Telephone Note  I connected with Latoya Martinez on 07/16/20 at  3:00 PM EDT by telephone and verified that I am speaking with the correct person using two identifiers.   I discussed the limitations, risks, security and privacy concerns of performing an evaluation and management service by telephone and the availability of in person appointments. I also discussed with the patient that there may be a patient responsible charge related to this service. The patient expressed understanding and agreed to proceed.  Location patient: home Location provider: work or home office Participants present for the call: patient, provider Patient did not have a visit in the prior 7 days to address this/these issue(s).   History of Present Illness: Patient contacted for 67-month follow-up on ADHD meds.  Patient taking Adderall 10 mg twice daily.  Pt states she got a new promotion at work which is keeping her busy.  [name] out of town tomorrow to [employer], then to [place] for business. Pt denies changes in weight, appetite, energy, sleep.  Patient tries to work out regularly.  Notes improvement in allergies on Flonase.   Observations/Objective: Patient sounds cheerful and well on the phone. I do not appreciate any SOB. Speech and thought processing are grossly intact. Patient reported vitals:  Assessment and Plan: Attention deficit hyperactivity disorder (ADHD), unspecified ADHD type  -stable -continue adderall 10 mg BID -PDMP reviewed - Plan: amphetamine-dextroamphetamine (ADDERALL) 10 MG tablet, amphetamine-dextroamphetamine (ADDERALL) 10 MG tablet, amphetamine-dextroamphetamine (ADDERALL) 10 MG tablet  Seasonal allergies -controlled -continue flonase  - Plan: fluticasone (FLONASE) 50 MCG/ACT nasal spray    Follow Up Instructions: F/u in 3 months   99441 5-10 [id number] 11-20 9443 21-30 I did not refer this patient for an OV in the next 24 hours for this/these  issue(s).  I discussed the assessment and treatment plan with the patient. The patient was provided an opportunity to ask questions and all were answered. The patient agreed with the plan and demonstrated an understanding of the instructions.   The patient was advised to call back or seek an in-person evaluation if the symptoms worsen or if the condition fails to improve as anticipated.  I provided 5:25 minutes of non-face-to-face time during this encounter.   [name], MD

## 2020-07-29 ENCOUNTER — Telehealth: Payer: Self-pay | Admitting: Family Medicine

## 2020-07-29 DIAGNOSIS — L02419 Cutaneous abscess of limb, unspecified: Secondary | ICD-10-CM

## 2020-07-29 NOTE — Telephone Encounter (Signed)
Patient called to state that she would like referrals to see one of two dermatologists here in [place] due to a bump on her arm that she would like to get checked out.   Patient would like a call back at [phone].  Please advise.

## 2020-07-29 NOTE — Telephone Encounter (Signed)
ATC patient, no answer, left vm informing her referral has been placed for dermatology.

## 2020-08-12 ENCOUNTER — Other Ambulatory Visit: Payer: Self-pay | Admitting: Family Medicine

## 2020-08-12 DIAGNOSIS — F909 Attention-deficit hyperactivity disorder, unspecified type: Secondary | ICD-10-CM

## 2020-08-12 NOTE — Telephone Encounter (Signed)
Pt call and stated she need a refill on amphetamine-dextroamphetamine (ADDERALL) 10 MG tablet sent to  °[id number] - [place], [place] - [address] Phone:  [phone]  °Fax:  [phone]  °  ° °

## 2020-08-13 ENCOUNTER — Telehealth: Payer: Self-pay | Admitting: Dermatology

## 2020-08-13 NOTE — Telephone Encounter (Signed)
Patient is calling for a referral appointment from Abbe Amsterdam, MD.  Patient is scheduled for 02/22/2021 at 2:00 with Janalyn Harder, MD.

## 2020-08-13 NOTE — Telephone Encounter (Signed)
Referral attached to appointment

## 2020-10-08 ENCOUNTER — Encounter: Payer: Self-pay | Admitting: Family Medicine

## 2020-10-08 ENCOUNTER — Telehealth (INDEPENDENT_AMBULATORY_CARE_PROVIDER_SITE_OTHER): Payer: No Typology Code available for payment source | Admitting: Family Medicine

## 2020-10-08 DIAGNOSIS — S93402A Sprain of unspecified ligament of left ankle, initial encounter: Secondary | ICD-10-CM

## 2020-10-08 DIAGNOSIS — F909 Attention-deficit hyperactivity disorder, unspecified type: Secondary | ICD-10-CM

## 2020-10-08 DIAGNOSIS — J3089 Other allergic rhinitis: Secondary | ICD-10-CM

## 2020-10-08 MED ORDER — AMPHETAMINE-DEXTROAMPHETAMINE 10 MG PO TABS: 10.0000 mg | ORAL_TABLET | Freq: Two times a day (BID) | ORAL | 0 refills | Status: DC

## 2020-10-08 MED ORDER — AZELASTINE HCL 0.1 % NA SOLN: NASAL | 2 refills | Status: DC

## 2020-10-08 NOTE — Progress Notes (Signed)
Virtual Visit via Telephone Note  I connected with Latoya Martinez on 10/08/20 at 10:30 AM EDT by telephone and verified that I am speaking with the correct person using two identifiers.   I discussed the limitations, risks, security and privacy concerns of performing an evaluation and management service by telephone and the availability of in person appointments. I also discussed with the patient that there may be a patient responsible charge related to this service. The patient expressed understanding and agreed to proceed.  Location patient: home Location provider: work or home office Participants present for the call: patient, provider Patient did not have a visit in the prior 7 days to address this/these issue(s).  Chief Complaint  Patient presents with   Medication Refill    History of Present Illness: Pt doing well. Seen for refills and f/u.  Stable and doing well on Adderrall 10 mg.  Staying busy at work.  Doing weight training and hiking.  Notices edema of L ankle when wt training x several wks.  Pt does not recall injury.  Endorses a high pain tolerance.  Iced it in the beginning.  Bought new sneakers.  Pt has not had any COVID vaccines.  Pt's son recently had a bad case of COVID.  Doing better now.   Observations/Objective: Patient sounds cheerful and well on the phone. I do not appreciate any SOB. Speech and thought processing are grossly intact. Patient reported vitals:  Assessment and Plan: Attention deficit hyperactivity disorder (ADHD), unspecified ADHD type  -stable -PDMP reviewed - Plan: amphetamine-dextroamphetamine (ADDERALL) 10 MG tablet, amphetamine-dextroamphetamine (ADDERALL) 10 MG tablet, amphetamine-dextroamphetamine (ADDERALL) 10 MG tablet  Environmental and seasonal allergies  - Plan: azelastine (ASTELIN) 0.1 % nasal spray  Ankle sprain, Left -supportive care including compression, ice, heat, stretching, elevation -Tylenol or ibuprofen as  needed for any pain/discomfort -For continued or worsening symptoms in the next few weeks follow-up in clinic.  Consider imaging at that time.  Follow Up Instructions:  F/u in 3 months for ADHD follow-up.  Follow-up as needed for continued ankle symptoms.  I did not refer this patient for an OV in the next 24 hours for this/these issue(s).  I discussed the assessment and treatment plan with the patient. The patient was provided an opportunity to ask questions and all were answered. The patient agreed with the plan and demonstrated an understanding of the instructions.   The patient was advised to call back or seek an in-person evaluation if the symptoms worsen or if the condition fails to improve as anticipated.  I provided 19 minutes of non-face-to-face time during this encounter.  Latoya Saint, MD

## 2020-10-16 ENCOUNTER — Other Ambulatory Visit: Payer: Self-pay | Admitting: Family Medicine

## 2020-10-16 ENCOUNTER — Telehealth: Payer: Self-pay | Admitting: Family Medicine

## 2020-10-16 DIAGNOSIS — F909 Attention-deficit hyperactivity disorder, unspecified type: Secondary | ICD-10-CM

## 2020-10-16 MED ORDER — AMPHETAMINE-DEXTROAMPHETAMINE 10 MG PO TABS: 10.0000 mg | ORAL_TABLET | Freq: Two times a day (BID) | ORAL | 0 refills | Status: DC

## 2020-10-16 NOTE — Telephone Encounter (Signed)
Patient called back very irate about prior message pt stats she will be traveling this afternoon and needs Rx before 3pm

## 2020-10-16 NOTE — Telephone Encounter (Signed)
[facility] called back and [facility]  [facility] [address] [place], [place] [address]

## 2020-10-16 NOTE — Telephone Encounter (Signed)
Patient called because a refill was due on her amphetamine-dextroamphetamine (ADDERALL) 10 MG tablet and her pharmacy is out of it. Wants it sent to another pharmacy, but did not  specify which one     Please Advise

## 2020-11-11 ENCOUNTER — Telehealth: Payer: Self-pay | Admitting: Family Medicine

## 2020-11-11 NOTE — Telephone Encounter (Signed)
Patient called because she needs a refill on amphetamine-dextroamphetamine (ADDERALL) 10 MG tablet    [facility]  [name], [place] - [address] Phone:  [phone]  Fax:  [phone]        Good callback number is [phone]     Please Advise

## 2020-11-11 NOTE — Telephone Encounter (Signed)
Spoke with pharmacy, pt has refill at pharmacy that will be released when ready.

## 2020-12-10 ENCOUNTER — Telehealth (INDEPENDENT_AMBULATORY_CARE_PROVIDER_SITE_OTHER): Payer: No Typology Code available for payment source | Admitting: Family Medicine

## 2020-12-10 ENCOUNTER — Encounter: Payer: Self-pay | Admitting: Family Medicine

## 2020-12-10 DIAGNOSIS — F909 Attention-deficit hyperactivity disorder, unspecified type: Secondary | ICD-10-CM

## 2020-12-10 DIAGNOSIS — J302 Other seasonal allergic rhinitis: Secondary | ICD-10-CM | POA: Diagnosis not present

## 2020-12-10 MED ORDER — AMPHETAMINE-DEXTROAMPHETAMINE 10 MG PO TABS: 10.0000 mg | ORAL_TABLET | Freq: Two times a day (BID) | ORAL | 0 refills | Status: DC

## 2020-12-10 NOTE — Progress Notes (Signed)
Virtual Visit via Telephone Note  I connected with Latoya Martinez on 12/10/20 at 10:00 AM EDT by telephone and verified that I am speaking with the correct person using two identifiers.   I discussed the limitations, risks, security and privacy concerns of performing an evaluation and management service by telephone and the availability of in person appointments. I also discussed with the patient that there may be a patient responsible charge related to this service. The patient expressed understanding and agreed to proceed.  Location patient: home Location provider: work or home office Participants present for the call: patient, provider Patient did not have a visit in the prior 7 days to address this/these issue(s).   History of Present Illness: Pt seen for f/u on ADHD.  Stable on Adderall 10 mg BID.  Pt busy at work, just closed a big deal and has several meetings coming up.  Concentration is good.  Wt stable.  Sleep and appetite good.  Planning to go on vacation to [place] with friends for 2 wks.  Using Flonase nasal spray and over-the-counter allergy medication.  Feels fine allergy pill may not be working as well as it used to.  Notes increased allergy symptoms including nasal drainage and rhinorrhea over the last few weeks.  Symptoms worse first thing in the morning.  Drainage causing mild cough.  Had to take 2 allergy pills yesterday.   Observations/Objective: Patient sounds cheerful and well on the phone. I do not appreciate any SOB. Speech and thought processing are grossly intact. Patient reported vitals:  Assessment and Plan: Attention deficit hyperactivity disorder (ADHD), unspecified ADHD type  -stable -continue Adderall 10 mg BID -PDMP reviewed - Plan: amphetamine-dextroamphetamine (ADDERALL) 10 MG tablet, amphetamine-dextroamphetamine (ADDERALL) 10 MG tablet, amphetamine-dextroamphetamine (ADDERALL) 10 MG tablet  Seasonal allergies -continue flonase and OTC  antihistamine  -Advised to switch allergy pill as current med not working as well.  Follow Up Instructions: F/u in 3 months   99441 5-10 99442 11-20 9443 21-30 I did not refer this patient for an OV in the next 24 hours for this/these issue(s).  I discussed the assessment and treatment plan with the patient. The patient was provided an opportunity to ask questions and all were answered. The patient agreed with the plan and demonstrated an understanding of the instructions.   The patient was advised to call back or seek an in-person evaluation if the symptoms worsen or if the condition fails to improve as anticipated.  I provided 12 minutes of non-face-to-face time during this encounter.   Latoya Saint, MD

## 2021-02-09 ENCOUNTER — Other Ambulatory Visit: Payer: Self-pay | Admitting: Family Medicine

## 2021-02-09 DIAGNOSIS — J3089 Other allergic rhinitis: Secondary | ICD-10-CM

## 2021-02-11 ENCOUNTER — Telehealth: Payer: Self-pay | Admitting: Family Medicine

## 2021-02-11 NOTE — Telephone Encounter (Signed)
Pt call and stated she need a refill on amphetamine-dextroamphetamine (ADDERALL) 10 MG tablet [facility] to  [name], [place] - [address] Phone:  [phone]  Fax:  [phone]

## 2021-02-12 NOTE — Telephone Encounter (Signed)
Spoke with pharmacy, Rx was on file. They will fill it now for pt.

## 2021-02-16 ENCOUNTER — Telehealth: Payer: Self-pay | Admitting: Family Medicine

## 2021-02-16 NOTE — Telephone Encounter (Signed)
Pt was only able to get #23 of  adderall due to that was the only amount in stock and needs new rx   for there remaining adderall #37 pills to complete the amount of #60 per month  [id number], [place] Phone:  [phone]  Fax:  [phone]

## 2021-02-22 ENCOUNTER — Ambulatory Visit: Payer: No Typology Code available for payment source | Admitting: Dermatology

## 2021-02-26 NOTE — Telephone Encounter (Signed)
See should provide remaining refill for pt once medication is available.  Can call pharmacy to check on this.

## 2021-03-05 ENCOUNTER — Other Ambulatory Visit: Payer: Self-pay | Admitting: Family Medicine

## 2021-03-05 DIAGNOSIS — F909 Attention-deficit hyperactivity disorder, unspecified type: Secondary | ICD-10-CM

## 2021-03-05 MED ORDER — AMPHETAMINE-DEXTROAMPHETAMINE 10 MG PO TABS: 10.0000 mg | ORAL_TABLET | Freq: Two times a day (BID) | ORAL | 0 refills | Status: DC

## 2021-03-05 NOTE — Telephone Encounter (Signed)
Rx written to complete partial refill pt was given.

## 2021-03-15 ENCOUNTER — Telehealth: Payer: No Typology Code available for payment source | Admitting: Family Medicine

## 2021-03-15 ENCOUNTER — Other Ambulatory Visit: Payer: Self-pay

## 2021-03-15 ENCOUNTER — Encounter: Payer: Self-pay | Admitting: Family Medicine

## 2021-03-15 DIAGNOSIS — F909 Attention-deficit hyperactivity disorder, unspecified type: Secondary | ICD-10-CM

## 2021-03-15 MED ORDER — AMPHETAMINE-DEXTROAMPHETAMINE 10 MG PO TABS: 10.0000 mg | ORAL_TABLET | Freq: Two times a day (BID) | ORAL | 0 refills | Status: DC

## 2021-03-15 NOTE — Progress Notes (Signed)
Virtual Visit via Telephone Note  I connected with Latoya Martinez on 03/15/21 at  2:00 PM EST by telephone and verified that I am speaking with the correct person using two identifiers.   I discussed the limitations, risks, security and privacy concerns of performing an evaluation and management service by telephone and the availability of in person appointments. I also discussed with the patient that there may be a patient responsible charge related to this service. The patient expressed understanding and agreed to proceed.  Location patient: home Location provider: work or home office Participants present for the call: patient, provider Patient did not have a visit in the prior 7 days to address this/these issue(s).   History of Present Illness: Pt seen for q 3 month f/u on ADHD.  Pt was only able to get a partial fill on Adderall for the month of January as her pharmacy did not have enough pills to fill the order.  Another rx was sent in for the remaining pills which she was able to have filled.  Pt denies any issues with the medication.  States sleep, appetite, mood, and concentration are good.  Weight has been stable.  Pt slightly stressed as she is having an issue with her work [employer].    Observations/Objective: Patient sounds cheerful, mildly anxious, but well on the phone. I do not appreciate any SOB. Speech and thought processing are grossly intact. Patient reported vitals: RR between 12-20 bpm  Assessment and Plan: Attention deficit hyperactivity disorder (ADHD), unspecified ADHD type  -stable -continue Adderall 10 mg BID -PDMP reviewed. -Patient will notify clinic if unable to get prescription filled in her normal pharmacy if product is unavailable. - Plan: amphetamine-dextroamphetamine (ADDERALL) 10 MG tablet, amphetamine-dextroamphetamine (ADDERALL) 10 MG tablet, amphetamine-dextroamphetamine (ADDERALL) 10 MG tablet   Follow Up Instructions: F/u in 3  months.   99441 5-10 99442 11-20 9443 21-30 I did not refer this patient for an OV in the next 24 hours for this/these issue(s).  I discussed the assessment and treatment plan with the patient. The patient was provided an opportunity to ask questions and all were answered. The patient agreed with the plan and demonstrated an understanding of the instructions.   The patient was advised to call back or seek an in-person evaluation if the symptoms worsen or if the condition fails to improve as anticipated.  I provided 13 minutes of non-face-to-face time during this encounter.   [name], MD

## 2021-03-16 ENCOUNTER — Telehealth: Payer: Self-pay | Admitting: Family Medicine

## 2021-03-16 DIAGNOSIS — F909 Attention-deficit hyperactivity disorder, unspecified type: Secondary | ICD-10-CM

## 2021-03-16 NOTE — Telephone Encounter (Signed)
Pt is calling and [facility] [address] [facility] not [facility] adderall in stock please sent to  [name], [place] - [address] AT [place] [address] Phone:  [phone]  Fax:  [phone]

## 2021-03-17 MED ORDER — AMPHETAMINE-DEXTROAMPHETAMINE 10 MG PO TABS: 10.0000 mg | ORAL_TABLET | Freq: Two times a day (BID) | ORAL | 0 refills | Status: DC

## 2021-03-23 NOTE — Telephone Encounter (Signed)
Patient called in stating that [facility] on [address] have the adderall.   Patient would like to be contacted at [phone] when it's done.  Please advise.

## 2021-03-23 NOTE — Telephone Encounter (Signed)
Pt is calling back and would like adderall sent to [facility] [address] today and would like a callback

## 2021-03-24 ENCOUNTER — Telehealth: Payer: Self-pay | Admitting: Family Medicine

## 2021-03-24 NOTE — Telephone Encounter (Signed)
Pt want a call back when the refill on amphetamine-dextroamphetamine (ADDERALL) 10 MG tablet  is call in .

## 2021-03-24 NOTE — Addendum Note (Signed)
Addended by: Elza Rafter D on: 03/24/2021 07:38 AM   Modules accepted: Orders

## 2021-03-24 NOTE — Telephone Encounter (Signed)
Last refill per controlled substance database: 02/23/21 Last OV: VV on 03/15/21 Next OV: none scheduled.

## 2021-03-24 NOTE — Telephone Encounter (Signed)
Sent request for controlled RX to Dr. Salomon Fick for review. Asked her to alert clinical pool of RX status and staff will contact patient.

## 2021-03-25 ENCOUNTER — Other Ambulatory Visit: Payer: Self-pay

## 2021-03-25 ENCOUNTER — Telehealth: Payer: No Typology Code available for payment source | Admitting: Family Medicine

## 2021-03-25 ENCOUNTER — Other Ambulatory Visit: Payer: Self-pay | Admitting: Family Medicine

## 2021-03-25 MED ORDER — AMPHETAMINE-DEXTROAMPHETAMINE 10 MG PO TABS: 10.0000 mg | ORAL_TABLET | Freq: Two times a day (BID) | ORAL | 0 refills | Status: DC

## 2021-03-25 MED ORDER — AMPHETAMINE-DEXTROAMPHETAMINE 10 MG PO TABS: 10.0000 mg | ORAL_TABLET | Freq: Every day | ORAL | 0 refills | Status: DC

## 2021-03-25 NOTE — Addendum Note (Signed)
Addended by: Grier Mitts R on: 03/25/2021 02:06 PM   Modules accepted: Orders

## 2021-03-25 NOTE — Telephone Encounter (Signed)
Pt would like dr banks to call her this morning concerning her adderall

## 2021-03-25 NOTE — Progress Notes (Signed)
RX for Adderall 10 mg twice daily sent to [facility] pharmacy on [address] per patient request.

## 2021-03-25 NOTE — Telephone Encounter (Signed)
Spoke with the patient. She is aware the medication has been sent to the requested pharmacy.

## 2021-03-25 NOTE — Telephone Encounter (Signed)
Not sure what is going on with this.  The medication (adderrall 10 mg BID) [facility] [address] [facility] requested on 03/17/21 per chart review.  Pharmacy confirmed receipt.  Please contact the pharmacy regarding this.

## 2021-03-25 NOTE — Telephone Encounter (Signed)
Pt has made a virtual appt and does not need dr banks to call her this morning

## 2021-04-19 ENCOUNTER — Telehealth (INDEPENDENT_AMBULATORY_CARE_PROVIDER_SITE_OTHER): Payer: No Typology Code available for payment source | Admitting: Family Medicine

## 2021-04-19 ENCOUNTER — Encounter: Payer: Self-pay | Admitting: Family Medicine

## 2021-04-19 DIAGNOSIS — F909 Attention-deficit hyperactivity disorder, unspecified type: Secondary | ICD-10-CM | POA: Diagnosis not present

## 2021-04-19 DIAGNOSIS — J441 Chronic obstructive pulmonary disease with (acute) exacerbation: Secondary | ICD-10-CM

## 2021-04-19 MED ORDER — PREDNISONE 20 MG PO TABS
40.0000 mg | ORAL_TABLET | Freq: Every day | ORAL | 0 refills | Status: AC
Start: 2021-04-19 — End: 2021-04-24

## 2021-04-19 MED ORDER — AMOXICILLIN-POT CLAVULANATE 500-125 MG PO TABS: 1.0000 | ORAL_TABLET | Freq: Two times a day (BID) | ORAL | 0 refills | Status: AC

## 2021-04-19 MED ORDER — ALBUTEROL SULFATE HFA 108 (90 BASE) MCG/ACT IN AERS: 2.0000 | INHALATION_SPRAY | Freq: Four times a day (QID) | RESPIRATORY_TRACT | 2 refills | Status: DC | PRN

## 2021-04-19 MED ORDER — AMPHETAMINE-DEXTROAMPHETAMINE 10 MG PO TABS: 10.0000 mg | ORAL_TABLET | Freq: Every day | ORAL | 0 refills | Status: DC

## 2021-04-19 NOTE — Progress Notes (Signed)
Virtual Visit via Telephone Note ? ?I connected with Latoya Martinez on 04/19/21 at  3:30 PM EST by telephone and verified that I am speaking with the correct person using two identifiers. ?  ?I discussed the limitations, risks, security and privacy concerns of performing an evaluation and management service by telephone and the availability of in person appointments. I also discussed with the patient that there may be a patient responsible charge related to this service. The patient expressed understanding and agreed to proceed. ? ?Location patient: home ?Location provider: work or home office ?Participants present for the call: patient, provider ?Patient did not have a visit in the prior 7 days to address this/these issue(s). ? ? ?History of Present Illness: ?Pt got sick over christmas/NYE while in [place].  Pt was unable to be seen in office.  Was told had bronchitis at [facility], given abx abd steroids.  2 wks ago pt states productive cough started.  Phlegm is yellow.  Pt states she was told she had COPD years ago.  Cough worse for 4 hours in the am, calms down some during the day.  Occasional wheezing.  Feels like she never got over the initial illness in Jan.  Taking flonase, azelastine for h/o allergies.  Denies fever, chills, n/v, HA, facial pain/pressure, sore throat. ?  ?Observations/Objective: ?Patient sounds cheerful and well on the phone. ?I do not appreciate any SOB. ?Speech and thought processing are grossly intact. ?Patient reported vitals: ? ?Assessment and Plan: ? ?COPD exacerbation (HCC)  ?-increased sputum production, cough.  Given h/o COPD, will treat as COPD exacerbation. ?-start abx, prednisone burst, albuterol inhaler. ?- Plan: predniSONE (DELTASONE) 20 MG tablet, amoxicillin-clavulanate (AUGMENTIN) 500-125 MG tablet, albuterol inhaler ? ?Attention deficit hyperactivity disorder (ADHD), unspecified ADHD type  ?-stable ?-pt requesting rx be sent to [facility] on [address] 2/2 having  difficulty [facility] [address] ?-Rx for this month and next month sent to [facility] on [address]. ?-PDMP reviewed. ?- Plan: amphetamine-dextroamphetamine (ADDERALL) 10 MG tablet ? ?Follow Up Instructions: ?F/u prn. ? ?Pt to schedule CPE in the next few wks. ? ?I did not refer this patient for an OV in the next 24 hours for this/these issue(s). ? ?I discussed the assessment and treatment plan with the patient. The patient was provided an opportunity to ask questions and all were answered. The patient agreed with the plan and demonstrated an understanding of the instructions. ?  ?The patient was advised to call back or seek an in-person evaluation if the symptoms worsen or if the condition fails to improve as anticipated. ? ?I provided 20 minutes of non-face-to-face time during this encounter. ? ? ?[name], MD ? ?

## 2021-05-03 NOTE — Telephone Encounter (Signed)
error 

## 2021-05-11 ENCOUNTER — Other Ambulatory Visit: Payer: Self-pay | Admitting: Family Medicine

## 2021-05-11 DIAGNOSIS — J302 Other seasonal allergic rhinitis: Secondary | ICD-10-CM

## 2021-05-11 NOTE — Telephone Encounter (Signed)
Pt last VV appt with PCP, recommendation for CPE. Not noted to be scheduled at this time. ? ?LVM instructions for pt to call office to schedule CPE. ?

## 2021-05-15 ENCOUNTER — Other Ambulatory Visit: Payer: Self-pay | Admitting: Family Medicine

## 2021-05-15 DIAGNOSIS — J3089 Other allergic rhinitis: Secondary | ICD-10-CM

## 2021-05-17 ENCOUNTER — Encounter: Payer: Self-pay | Admitting: Family Medicine

## 2021-05-17 ENCOUNTER — Ambulatory Visit (INDEPENDENT_AMBULATORY_CARE_PROVIDER_SITE_OTHER): Payer: 59 | Admitting: Family Medicine

## 2021-05-17 VITALS — BP 123/76 | HR 107 | Temp 97.6°F | Ht 67.0 in | Wt 124.2 lb

## 2021-05-17 DIAGNOSIS — F909 Attention-deficit hyperactivity disorder, unspecified type: Secondary | ICD-10-CM

## 2021-05-17 DIAGNOSIS — Z1159 Encounter for screening for other viral diseases: Secondary | ICD-10-CM

## 2021-05-17 DIAGNOSIS — Z1211 Encounter for screening for malignant neoplasm of colon: Secondary | ICD-10-CM | POA: Diagnosis not present

## 2021-05-17 DIAGNOSIS — Z13220 Encounter for screening for lipoid disorders: Secondary | ICD-10-CM

## 2021-05-17 DIAGNOSIS — F17210 Nicotine dependence, cigarettes, uncomplicated: Secondary | ICD-10-CM

## 2021-05-17 DIAGNOSIS — J449 Chronic obstructive pulmonary disease, unspecified: Secondary | ICD-10-CM | POA: Insufficient documentation

## 2021-05-17 DIAGNOSIS — Z0000 Encounter for general adult medical examination without abnormal findings: Secondary | ICD-10-CM

## 2021-05-17 LAB — CBC WITH DIFFERENTIAL/PLATELET
Basophils Absolute: 0.1 10*3/uL (ref 0.0–0.1)
Basophils Relative: 0.9 % (ref 0.0–3.0)
Eosinophils Absolute: 0.1 10*3/uL (ref 0.0–0.7)
Eosinophils Relative: 1.2 % (ref 0.0–5.0)
HCT: 45.7 % (ref 36.0–46.0)
Hemoglobin: 15.7 g/dL — ABNORMAL HIGH (ref 12.0–15.0)
Lymphocytes Relative: 24.1 % (ref 12.0–46.0)
Lymphs Abs: 1.7 10*3/uL (ref 0.7–4.0)
MCHC: 34.3 g/dL (ref 30.0–36.0)
MCV: 98.6 fl (ref 78.0–100.0)
Monocytes Absolute: 0.6 10*3/uL (ref 0.1–1.0)
Monocytes Relative: 8.6 % (ref 3.0–12.0)
Neutro Abs: 4.6 10*3/uL (ref 1.4–7.7)
Neutrophils Relative %: 65.2 % (ref 43.0–77.0)
Platelets: 377 10*3/uL (ref 150.0–400.0)
RBC: 4.63 Mil/uL (ref 3.87–5.11)
RDW: 13.1 % (ref 11.5–15.5)
WBC: 7.1 10*3/uL (ref 4.0–10.5)

## 2021-05-17 LAB — COMPREHENSIVE METABOLIC PANEL
ALT: 19 U/L (ref 0–35)
AST: 29 U/L (ref 0–37)
Albumin: 4.7 g/dL (ref 3.5–5.2)
Alkaline Phosphatase: 89 U/L (ref 39–117)
BUN: 16 mg/dL (ref 6–23)
CO2: 25 mEq/L (ref 19–32)
Calcium: 10.4 mg/dL (ref 8.4–10.5)
Chloride: 105 mEq/L (ref 96–112)
Creatinine, Ser: 0.8 mg/dL (ref 0.40–1.20)
GFR: 82.76 mL/min (ref >60.00)
Glucose, Bld: 72 mg/dL (ref 70–99)
Potassium: 3.9 mEq/L (ref 3.5–5.1)
Sodium: 139 mEq/L (ref 135–145)
Total Bilirubin: 0.4 mg/dL (ref 0.2–1.2)
Total Protein: 7.1 g/dL (ref 6.0–8.3)

## 2021-05-17 LAB — HEMOGLOBIN A1C: Hgb A1c MFr Bld: 5.6 % (ref 4.6–6.5)

## 2021-05-17 LAB — LIPID PANEL
Cholesterol: 200 mg/dL (ref 0–200)
HDL: 78.2 mg/dL (ref >39.00)
LDL Cholesterol: 92 mg/dL (ref 0–99)
NonHDL: 121.8
Total CHOL/HDL Ratio: 3
Triglycerides: 149 mg/dL (ref 0.0–149.0)
VLDL: 29.8 mg/dL (ref 0.0–40.0)

## 2021-05-17 LAB — TSH: TSH: 1.56 u[IU]/mL (ref 0.35–5.50)

## 2021-05-17 LAB — T4, FREE: Free T4: 0.81 ng/dL (ref 0.60–1.60)

## 2021-05-17 NOTE — Progress Notes (Signed)
Subjective:  ?  ? Latoya Martinez is a 56 y.o. female and is here for a comprehensive physical exam.  Pt states she is doing well overall.  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  Pt stable on current meds.  Pt has not scheduled a mammogram or colonoscopy.  States checking breast periodically at home, denies d/c, skin changes, masses, tenderness.  Pt's mom with h/o colon ca.  Has one rx for ADHD med at [facility].  Pt will contact clinic if med is not available later this wk.  Pt doing strength training/exercising regularly, has been unable to in the last wk due to work.  Pt smoking 1-2 cigarettes per day when stressed.  In the past told she had COPD, no current issues.  Denies SOB, wheezing, coughing, increased sputum production. ? ?Social History  ? ?Socioeconomic History  ? Marital status: Divorced  ?  Spouse name: Not on file  ? Number of children: Not on file  ? Years of education: Not on file  ? Highest education level: Not on file  ?Occupational History  ? Not on file  ?Tobacco Use  ? Smoking status: Every Day  ? Smokeless tobacco: Current  ?Substance and Sexual Activity  ? Alcohol use: No  ? Drug use: No  ? Sexual activity: Not on file  ?Other Topics Concern  ? Not on file  ?Social History Narrative  ? Not on file  ? ?Social Determinants of Health  ? ?Financial Resource Strain: Not on file  ?Food Insecurity: Not on file  ?Transportation Needs: Not on file  ?Physical Activity: Not on file  ?Stress: Not on file  ?Social Connections: Not on file  ?Intimate Partner Violence: Not on file  ? ?Health Maintenance  ?Topic Date Due  ? COVID-19 Vaccine (1) Never done  ? HIV Screening  Never done  ? Hepatitis C Screening  Never done  ? TETANUS/TDAP  Never done  ? COLONOSCOPY (Pts 45-74yrs Insurance coverage will need to be confirmed)  Never done  ? MAMMOGRAM  08/19/2015  ? Zoster Vaccines- Shingrix (1 of 2) Never done  ? PAP SMEAR-Modifier  06/12/2018  ? INFLUENZA VACCINE  09/14/2021  ? HPV  VACCINES  Aged Out  ? ? ?The following portions of the patient's history were reviewed and updated as appropriate: allergies, current medications, past family history, past medical history, past social history, past surgical history, and problem list. ? ?Review of Systems ?Pertinent items noted in HPI and remainder of comprehensive ROS otherwise negative.  ? ?Objective:  ? ? BP 123/76 (BP Location: Right Arm, Patient Position: Sitting, Cuff Size: Normal)   Pulse (!) 107   Temp 97.6 ?F (36.4 ?C) (Oral)   Ht 5\' 7"  (1.702 m)   Wt 124 lb 3.2 oz (56.3 kg)   SpO2 96%   BMI 19.45 kg/m?  ?General appearance: alert, cooperative, and no distress ?Head: Normocephalic, without obvious abnormality, atraumatic ?Eyes: conjunctivae/corneas clear. PERRL, EOM's intact. Fundi benign. ?Ears: normal TM's and external ear canals both ears ?Nose: Nares normal. Septum midline. Mucosa normal. No drainage or sinus tenderness. ?Throat: lips, mucosa, and tongue normal; teeth and gums normal ?Neck: no adenopathy, no carotid bruit, no JVD, supple, symmetrical, trachea midline, and thyroid not enlarged, symmetric, no tenderness/mass/nodules ?Lungs: clear to auscultation bilaterally ?Heart: regular rate and rhythm, S1, S2 normal, no murmur, click, rub or gallop ?Abdomen: soft, non-tender; bowel sounds normal; no masses,  no organomegaly ?Extremities: extremities normal, atraumatic, no cyanosis  or edema ?Pulses: 2+ and symmetric ?Skin: Skin color, texture, turgor normal. No rashes or lesions ?Lymph nodes: Cervical, supraclavicular, and axillary nodes normal. ?Neurologic: Alert and oriented X 3, normal strength and tone. Normal symmetric reflexes. Normal coordination and gait  ?  ?Assessment:  ? ? Healthy female exam.    ?  ?Plan:  ? ? Anticipatory guidance given including wearing seatbelts, smoke detectors in the home, increasing physical activity, increasing p.o. intake of water and vegetables. ?-labs ?-discussed colonoscopy.  Pt hesitant to  have colon cancer screening.  Agrees to cologaurd, however discussed limitations ?-given info for mammogram.  Pt encouraged to schedule. ?-next CPE in 1 yr ?See After Visit Summary for Counseling Recommendations  ? ?Encounter for hepatitis C screening test for low risk patient  ?- Plan: Hep C Antibody ? ?Screening for cholesterol level  ?- Plan: Lipid panel ? ?Cigarette nicotine dependence without complication  ?-smoking cessation counseling >3 min, <10 min ?-currently smoking a few cigs per day when stressed ?-pt interested in quitting on her own without meds. ?-discussed finding other ways to deal with stress. ?-will continue to monitor at each visit. ?- Plan: CBC with Differential/Platelet, CMP ? ?Screen for colon cancer  ?-given increased risk, colonoscopy advised.  Pt declines colonoscopy. ?- Plan: Cologuard ? ?ADHD ?-stable on current meds ?-has one refill remaining at pharmacy, due for pick up 05/20/21.  Pt to notify clinic if pharmacy does not have Adderall in stock ?-PDMP reviewed. ?-will send in next 3 month supply of Adderall 10 mg BID at the beginning of May 2023. ? ?F/u in 3 months  ? ?Abbe Amsterdam, MD ? ?

## 2021-05-18 LAB — HEPATITIS C ANTIBODY
Hepatitis C Ab: NONREACTIVE
SIGNAL TO CUT-OFF: 0.03 (ref <1.00)

## 2021-05-25 ENCOUNTER — Telehealth: Payer: Self-pay | Admitting: Family Medicine

## 2021-05-25 NOTE — Telephone Encounter (Signed)
Patient calling in with respiratory symptoms: ?Shortness of breath, chest pain, palpitations or other red words send to Triage ? ?Does the patient have a fever over 100, cough, congestion, sore throat, runny nose, lost of taste/smell (please list symptoms that patient has) sore throat x 2 days ? ?What date did symptoms start?05-23-2020 ?(If over 5 days ago, pt may be scheduled for in person visit) ? ?Have you tested for Covid in the last 5 days? No  ? ?If yes, was it positive []  OR negative [] ? If positive in the last 5 days, please schedule virtual visit now. If negative, schedule for an in person OV with the next available provider if PCP has no openings. Please also let patient know they will be tested again (follow the script below) ? ?"you will have to arrive prior to your appt time to be Covid tested. Please park in back of office at the [facility] & call [phone] to let the staff know you have arrived. A staff member will meet you at your car to do a rapid covid test. Once the test has resulted you will be notified by phone of your results to determine if appt will remain an in person visit or be converted to a virtual/phone visit. If you arrive less than before your appt time, your visit will be automatically converted to virtual & any recommended testing will happen AFTER the visit." ?Pt has an appt with dr fry 05-26-2021 345 pm ? ?THINGS TO REMEMBER ? ?If no availability for virtual visit in office,  please schedule another [place] office ? ?If no availability at another [place] office, please instruct patient that they can schedule an evisit or virtual visit through their mychart account. Visits up to 8pm ? ?patients can be seen in office 5 days after positive COVID test ? ?  ?

## 2021-05-26 ENCOUNTER — Ambulatory Visit (INDEPENDENT_AMBULATORY_CARE_PROVIDER_SITE_OTHER): Payer: 59 | Admitting: Family Medicine

## 2021-05-26 ENCOUNTER — Encounter: Payer: Self-pay | Admitting: Family Medicine

## 2021-05-26 VITALS — BP 102/70 | HR 92 | Temp 97.4°F | Wt 124.0 lb

## 2021-05-26 DIAGNOSIS — J039 Acute tonsillitis, unspecified: Secondary | ICD-10-CM | POA: Diagnosis not present

## 2021-05-26 MED ORDER — METHYLPREDNISOLONE 4 MG PO TBPK: ORAL_TABLET | ORAL | 0 refills | Status: DC

## 2021-05-26 MED ORDER — CEPHALEXIN 500 MG PO CAPS: 500.0000 mg | ORAL_CAPSULE | Freq: Three times a day (TID) | ORAL | 0 refills | Status: AC

## 2021-05-26 NOTE — Progress Notes (Signed)
? ?  Subjective:  ? ? Patient ID: Rich Number, female    DOB: 11/29/1965, 56 y.o.   MRN: [id number] ? ?HPI ?Here for 3 days of swelling and pain in the right side of the back of her throat. No fever or body aches. No cough or rashes. Drinking fluids.  ? ? ?Review of Systems  ?Constitutional: Negative.   ?HENT:  Positive for sore throat, trouble swallowing and voice change. Negative for congestion, ear pain, mouth sores, postnasal drip and sinus pressure.   ?Eyes: Negative.   ?Respiratory: Negative.    ? ?   ?Objective:  ? Physical Exam ?Constitutional:   ?   Appearance: Normal appearance. She is not ill-appearing.  ?HENT:  ?   Right Ear: Tympanic membrane, ear canal and external ear normal.  ?   Left Ear: Tympanic membrane, ear canal and external ear normal.  ?   Nose: Nose normal.  ?   Mouth/Throat:  ?   Pharynx: Oropharynx is clear. No oropharyngeal exudate or posterior oropharyngeal erythema.  ?Eyes:  ?   Conjunctiva/sclera: Conjunctivae normal.  ?Neck:  ?   Comments: No palpable AC nodes but she is quite tender below the right mandible  ?Pulmonary:  ?   Effort: Pulmonary effort is normal.  ?   Breath sounds: Normal breath sounds.  ?Musculoskeletal:  ?   Cervical back: Neck supple.  ?Neurological:  ?   Mental Status: She is alert.  ? ? ? ? ? ?   ?Assessment & Plan:  ?Tonsillitis, treat with 10 days of Keflex. Add a Medrol dose pack for discomfort. Recheck as needed.  ?Alysia Penna, MD ? ? ?

## 2021-07-08 ENCOUNTER — Other Ambulatory Visit: Payer: Self-pay | Admitting: Family Medicine

## 2021-07-08 DIAGNOSIS — J302 Other seasonal allergic rhinitis: Secondary | ICD-10-CM

## 2021-07-14 ENCOUNTER — Telehealth: Payer: Self-pay | Admitting: Family Medicine

## 2021-07-14 NOTE — Telephone Encounter (Signed)
Patient called because she needs refill on amphetamine-dextroamphetamine (ADDERALL) 10 MG tablet for the next three months. Patient states that she needs it sent today as the refills run out tomorrow and wanted to know when she would know it was sent, I let her know that we can never guarantee that a prescription will be sent in same day. She stated that she would call the pharmacy to see if it was sent today and I told her that was fine.       Please send to   [id number] [name], [place] - [address] Phone:  [phone]  Fax:  [phone]          Please advise

## 2021-07-15 ENCOUNTER — Other Ambulatory Visit: Payer: Self-pay | Admitting: Family Medicine

## 2021-07-15 DIAGNOSIS — F909 Attention-deficit hyperactivity disorder, unspecified type: Secondary | ICD-10-CM

## 2021-07-15 MED ORDER — AMPHETAMINE-DEXTROAMPHETAMINE 10 MG PO TABS: 10.0000 mg | ORAL_TABLET | Freq: Every day | ORAL | 0 refills | Status: DC

## 2021-07-15 MED ORDER — AMPHETAMINE-DEXTROAMPHETAMINE 10 MG PO TABS: 10.0000 mg | ORAL_TABLET | Freq: Two times a day (BID) | ORAL | 0 refills | Status: DC

## 2021-07-15 NOTE — Telephone Encounter (Signed)
Refills sent to pharmacy. 

## 2021-08-10 ENCOUNTER — Other Ambulatory Visit: Payer: Self-pay | Admitting: Family Medicine

## 2021-08-10 DIAGNOSIS — J3089 Other allergic rhinitis: Secondary | ICD-10-CM

## 2021-08-23 ENCOUNTER — Encounter: Payer: Self-pay | Admitting: Family Medicine

## 2021-08-23 ENCOUNTER — Telehealth (INDEPENDENT_AMBULATORY_CARE_PROVIDER_SITE_OTHER): Payer: 59 | Admitting: Family Medicine

## 2021-08-23 DIAGNOSIS — J019 Acute sinusitis, unspecified: Secondary | ICD-10-CM

## 2021-08-23 DIAGNOSIS — J069 Acute upper respiratory infection, unspecified: Secondary | ICD-10-CM

## 2021-08-23 MED ORDER — AMOXICILLIN 500 MG PO TABS: 500.0000 mg | ORAL_TABLET | Freq: Two times a day (BID) | ORAL | 0 refills | Status: AC

## 2021-08-23 NOTE — Progress Notes (Signed)
Virtual Visit via Video Note  I connected with Latoya Martinez on 08/23/21 at  4:15 PM EDT by a video enabled telemedicine application 2/2 COVID-19 pandemic and verified that I am speaking with the correct person using two identifiers.  Location patient: home Location provider:work or home office Persons participating in the virtual visit: patient, provider  I discussed the limitations of evaluation and management by telemedicine and the availability of in person appointments. The patient expressed understanding and agreed to proceed.   HPI: Pt states on Saturday she developed a productive cough that tasted like pus  Pt stayed in bed all day Sunday.  Still feeling down/ no energy, HA, Nasal congestion.  On Friday night had some loose stools. Denies ear pain/pressure, n/v, sore throat. Drinking tea, fluids, ibuprofen, flonase.  Pt took leftover amoxicillin x 3. Pt notes increased travel before symptoms started.  Pt currently in [place], [place] for the next few wks for work.  ROS: See pertinent positives and negatives per HPI.  Past Medical History:  Diagnosis Date   Allergy    Heart murmur     Past Surgical History:  Procedure Laterality Date   ABDOMINAL HYSTERECTOMY      Family History  Problem Relation Age of Onset   Arthritis Mother    Cancer Mother    Hearing loss Mother    Cancer Father    Depression Sister    Asthma Brother      Current Outpatient Medications:    albuterol (VENTOLIN HFA) 108 (90 Base) MCG/ACT inhaler, Inhale 2 puffs into the lungs every 6 (six) hours as needed for wheezing or shortness of breath., Disp: 6.7 g, Rfl: 2   amphetamine-dextroamphetamine (ADDERALL) 10 MG tablet, Take 1 tablet (10 mg total) by mouth 2 (two) times daily., Disp: 60 tablet, Rfl: 0   amphetamine-dextroamphetamine (ADDERALL) 10 MG tablet, Take 1 tablet (10 mg total) by mouth daily with breakfast., Disp: 60 tablet, Rfl: 0   amphetamine-dextroamphetamine (ADDERALL) 10 MG tablet, Take  1 tablet (10 mg total) by mouth daily with breakfast., Disp: 60 tablet, Rfl: 0   Azelastine HCl 137 MCG/SPRAY SOLN, SPRAY 1 TIME INTO EACH NOSTRIL TWICE A DAY, Disp: 30 mL, Rfl: 2   fluticasone (FLONASE) 50 MCG/ACT nasal spray, TAKE 2 SPRAYS IN EACH NOSTRIL EVERY DAY AT NIGHT, Disp: 16 mL, Rfl: 5   methylPREDNISolone (MEDROL DOSEPAK) 4 MG TBPK tablet, As directed (Patient not taking: Reported on 08/23/2021), Disp: 21 tablet, Rfl: 0  EXAM:  VITALS per patient if applicable: RR between 12-20 bpm  GENERAL: alert, oriented, appears well and in no acute distress  HEENT: atraumatic, conjunctiva clear, no obvious abnormalities on inspection of external nose and ears  NECK: normal movements of the head and neck  LUNGS: on inspection no signs of respiratory distress, breathing rate appears normal, no obvious gross SOB, gasping or wheezing  CV: no obvious cyanosis  MS: moves all visible extremities without noticeable abnormality  PSYCH/NEURO: pleasant and cooperative, no obvious depression or anxiety, speech and thought processing grossly intact  ASSESSMENT AND PLAN:  Discussed the following assessment and plan:  Viral URI -likely 2/2 increased travel. -expectant management of symptoms including OTC cough/cold meds, rest, hydration, Tylenol or NSAIDs as needed -Continue Flonase nasal spray -Patient advised against ABX overuse/taking leftover antibiotics.  Acute sinusitis, recurrence not specified, unspecified location -Wait-and-see Rx as patient is out of town -Continue supportive care including Flonase or azelastine nasal spray, steam from shower, OTC allergy medication -Given precautions - Plan: amoxicillin (  AMOXIL) 500 MG tablet  Follow-up as needed   I discussed the assessment and treatment plan with the patient. The patient was provided an opportunity to ask questions and all were answered. The patient agreed with the plan and demonstrated an understanding of the instructions.    The patient was advised to call back or seek an in-person evaluation if the symptoms worsen or if the condition fails to improve as anticipated.  [name], MD

## 2021-09-06 ENCOUNTER — Telehealth: Payer: Self-pay | Admitting: Family Medicine

## 2021-09-06 NOTE — Telephone Encounter (Signed)
Patient needs amphetamine-dextroamphetamine (ADDERALL) 10 MG [facility] [place], as she is on a business trip and will be there for another week.      Please send to   [id number] [place], [place] - [address] Phone:  [phone]  Fax:  [phone]          Please advise

## 2021-09-07 ENCOUNTER — Telehealth: Payer: Self-pay | Admitting: Family Medicine

## 2021-09-07 NOTE — Telephone Encounter (Signed)
Patient needs amphetamine-dextroamphetamine (ADDERALL) 10 MG tablet [facility] [place], as she is on a business trip and will be there for another week.   Please send to    [id number] [place], [place] - [address] Phone:  [phone]  Fax:  [phone]

## 2021-09-08 NOTE — Telephone Encounter (Signed)
Pt is aware md in back in office today

## 2021-09-09 ENCOUNTER — Other Ambulatory Visit: Payer: Self-pay | Admitting: Family Medicine

## 2021-09-09 ENCOUNTER — Telehealth: Payer: Self-pay | Admitting: Family Medicine

## 2021-09-09 DIAGNOSIS — F909 Attention-deficit hyperactivity disorder, unspecified type: Secondary | ICD-10-CM

## 2021-09-09 MED ORDER — AMPHETAMINE-DEXTROAMPHETAMINE 10 MG PO TABS: 10.0000 mg | ORAL_TABLET | Freq: Two times a day (BID) | ORAL | 0 refills | Status: DC

## 2021-09-09 NOTE — Telephone Encounter (Signed)
Rx sent to pharmacy   

## 2021-09-09 NOTE — Telephone Encounter (Signed)
Patient needs amphetamine-dextroamphetamine (ADDERALL) 10 MG tablet sent to [facility] [place], as she is on a business trip and will be there for another week.   Please send to    [id number] [place], [place] - [address] Phone:  [phone]  Fax:  [phone]    Pt states she got a message from the [place] [facility] stating it was ready however per the note the patient is in [place] on business and it needs to go to that location. Requests a call when the prescription has been changed.

## 2021-09-15 NOTE — Telephone Encounter (Signed)
Rx was sent. See note

## 2021-09-16 ENCOUNTER — Telehealth: Payer: Self-pay | Admitting: Family Medicine

## 2021-09-16 NOTE — Telephone Encounter (Signed)
Pt requesting this be filled so that she can take it BID amphetamine-dextroamphetamine (ADDERALL) 10 MG tablet [id number] [name], [place] - [address] Phone:  [phone]  Fax:  [phone]

## 2021-09-21 ENCOUNTER — Other Ambulatory Visit: Payer: Self-pay | Admitting: Family Medicine

## 2021-09-21 DIAGNOSIS — F909 Attention-deficit hyperactivity disorder, unspecified type: Secondary | ICD-10-CM

## 2021-09-21 MED ORDER — AMPHETAMINE-DEXTROAMPHETAMINE 10 MG PO TABS: 10.0000 mg | ORAL_TABLET | Freq: Two times a day (BID) | ORAL | 0 refills | Status: DC

## 2021-09-21 NOTE — Telephone Encounter (Signed)
RX was sent to pharmacy in [place] on 09/09/21, however not sure if pt was aware rx was available as it does not appear that it was picked up per PMDP.  A new rx was [facility] on [address] for Adderall 10 mg BID.

## 2021-11-01 ENCOUNTER — Telehealth: Payer: Self-pay | Admitting: Family Medicine

## 2021-11-01 NOTE — Telephone Encounter (Signed)
Pt would like a refill on amphetamine-dextroamphetamine (ADDERALL) 10 MG tablet  [id number] [place], [place] - [address] Phone:  [phone]  Fax:  [phone]

## 2021-11-03 NOTE — Telephone Encounter (Signed)
Pt called to say she still has not been able to get her prescription and she also wants to remind MD to PLEASE specify the directions as (2) 10 mg tablets daily.  Pt states she is completely out and is getting very worried. LOV:  05/26/21 LVV:  08/23/21  Please advise.  [id number] [place], [place] Phone:  [phone]  Fax:  [phone]

## 2021-11-04 ENCOUNTER — Other Ambulatory Visit: Payer: Self-pay | Admitting: Family Medicine

## 2021-11-04 DIAGNOSIS — F909 Attention-deficit hyperactivity disorder, unspecified type: Secondary | ICD-10-CM

## 2021-11-04 MED ORDER — AMPHETAMINE-DEXTROAMPHETAMINE 10 MG PO TABS: 10.0000 mg | ORAL_TABLET | Freq: Two times a day (BID) | ORAL | 0 refills | Status: DC

## 2021-11-04 NOTE — Telephone Encounter (Signed)
Pt called again this morning very upset and worried, stating she should not have to call 5 days in a row to get her medication. Pt stated she is not kidding, she needs this prescription refilled today, because she is going out of town tomorrow.  Please advise.  LOV:  05/26/21 LVV:  08/23/21   Does MD want Pt to do a VV or an OV to speed up the process?  Please advise.   [id number] [place], [place] Phone:  [phone]  Fax:  [phone]

## 2021-11-04 NOTE — Telephone Encounter (Signed)
A refill was sent to pharmacy.  Please be advised of med refill policy.  Advise pt she has not called 5 days in a row she called 3 days ago, when this provider was not in the office.  Med requires q 3 month f/u.  Last VV was for cold symptoms.

## 2021-11-05 NOTE — Telephone Encounter (Signed)
Pt was called to schedule an OV. Pt stated she was driving and it was pouring rain and she needed to concentrate on her driving and would call back on Monday to schedule a visit.

## 2021-11-08 ENCOUNTER — Encounter: Payer: Self-pay | Admitting: Family Medicine

## 2021-11-08 ENCOUNTER — Telehealth (INDEPENDENT_AMBULATORY_CARE_PROVIDER_SITE_OTHER): Payer: 59 | Admitting: Family Medicine

## 2021-11-08 DIAGNOSIS — F909 Attention-deficit hyperactivity disorder, unspecified type: Secondary | ICD-10-CM

## 2021-11-08 DIAGNOSIS — J0140 Acute pansinusitis, unspecified: Secondary | ICD-10-CM

## 2021-11-08 MED ORDER — AMPHETAMINE-DEXTROAMPHETAMINE 10 MG PO TABS: 10.0000 mg | ORAL_TABLET | Freq: Two times a day (BID) | ORAL | 0 refills | Status: DC

## 2021-11-08 MED ORDER — AMOXICILLIN-POT CLAVULANATE 500-125 MG PO TABS: 1.0000 | ORAL_TABLET | Freq: Two times a day (BID) | ORAL | 0 refills | Status: AC

## 2021-11-08 NOTE — Progress Notes (Signed)
Virtual Visit via Telephone Note  I connected with Latoya Martinez on 11/08/21 at  4:45 PM EDT by telephone and verified that I am speaking with the correct person using two identifiers.   I discussed the limitations, risks, security and privacy concerns of performing an evaluation and management service by telephone and the availability of in person appointments. I also discussed with the patient that there may be a patient responsible charge related to this service. The patient expressed understanding and agreed to proceed.  Location patient: home Location provider: work or home office Participants present for the call: patient, provider Patient did not have a visit in the prior 7 days to address this/these issue(s). Chief Complaint  Patient presents with   Sinus Problem    Started about a wk ago, thought it was allergies, and started getting worse. Just took what is on med list and mucinex    History of Present Illness: Pt states she developed increased congestion in sinus, HA, ear pain, fatigue, chills x a little over a wk.  Pt with increased pain in middle of head and temples. Denies fever. Pt currently in [place], [place] for business, but has to fly to [facility]. Pt states she has plenty flonase.  Rx for Adderral 10 mg BID sent to pharmacy last wk.  Pt was able to pick up Rx from pharmacy prior to heading out of town.  Patient stable on current dose.  Pt travels for work and may be in [place] or [place] as she has a place down there.    Observations/Objective: Patient sounds cheerful and well on the phone. I do not appreciate any SOB. Speech and thought processing are grossly intact. Patient reported vitals:  Assessment and Plan: Acute pansinusitis, recurrence not specified  -Start ABX -Discussed using Flonase nasal spray during upcoming travel. -Expectant management.  Tylenol as needed for any pain/discomfort. -Would check an at home COVID test. - Plan:  amoxicillin-clavulanate (AUGMENTIN) 500-125 MG tablet  Attention deficit hyperactivity disorder (ADHD), unspecified ADHD type -Stable -Continue Adderall 10 mg twice daily -Picked up Rx last week for this month.  Future refills x2 months sent to local pharmacy this visit. -Patient to check with insurance company regarding being able to pick up a 90-day supply instead of monthly. -PDMP reviewed.  - Plan: amphetamine-dextroamphetamine (ADDERALL) 10 MG tablet, amphetamine-dextroamphetamine (ADDERALL) 10 MG tablet   Follow Up Instructions:  F/u prn for acute symptoms.  Follow-up in 3 months for ADHD follow-up.   99441 5-10 99442 11-20 9443 21-30 I did not refer this patient for an OV in the next 24 hours for this/these issue(s).  I discussed the assessment and treatment plan with the patient. The patient was provided an opportunity to ask questions and all were answered. The patient agreed with the plan and demonstrated an understanding of the instructions.   The patient was advised to call back or seek an in-person evaluation if the symptoms worsen or if the condition fails to improve as anticipated.  I provided 21 minutes of non-face-to-face time during this encounter.  Billie Ruddy, MD

## 2021-12-13 ENCOUNTER — Telehealth (INDEPENDENT_AMBULATORY_CARE_PROVIDER_SITE_OTHER): Payer: 59 | Admitting: Family Medicine

## 2021-12-13 ENCOUNTER — Telehealth: Payer: Self-pay | Admitting: Family Medicine

## 2021-12-13 ENCOUNTER — Encounter: Payer: Self-pay | Admitting: Family Medicine

## 2021-12-13 DIAGNOSIS — Z8709 Personal history of other diseases of the respiratory system: Secondary | ICD-10-CM

## 2021-12-13 DIAGNOSIS — J988 Other specified respiratory disorders: Secondary | ICD-10-CM

## 2021-12-13 DIAGNOSIS — R051 Acute cough: Secondary | ICD-10-CM

## 2021-12-13 DIAGNOSIS — J321 Chronic frontal sinusitis: Secondary | ICD-10-CM

## 2021-12-13 MED ORDER — ALBUTEROL SULFATE HFA 108 (90 BASE) MCG/ACT IN AERS: 2.0000 | INHALATION_SPRAY | Freq: Four times a day (QID) | RESPIRATORY_TRACT | 3 refills | Status: DC | PRN

## 2021-12-13 MED ORDER — DOXYCYCLINE HYCLATE 100 MG PO TABS: 100.0000 mg | ORAL_TABLET | Freq: Two times a day (BID) | ORAL | 0 refills | Status: AC

## 2021-12-13 NOTE — Telephone Encounter (Signed)
Pt states SOB has subsided, has cough right now. Seen on VV 11/08/21 for sinus issues. VV scheduled for 5284, see "visit info" section for more information.

## 2021-12-13 NOTE — Telephone Encounter (Signed)
Patient was sent to triage for cough, congestion and SOB. Outcome was ED, patient refused Wants a virtual visit as she is out of town. Patient is in state though.    Treated for a sinus infection over a month ago with antibiotics but symptoms did not go away, got worse two to three days ago   Please advise

## 2021-12-13 NOTE — Progress Notes (Signed)
Virtual Visit via Video Note  I connected with Latoya Martinez on 12/13/21 at 11:45 AM EDT by a video enabled telemedicine application 2/2 COVID-19 pandemic and verified that I am speaking with the correct person using two identifiers.  Location patient: home Location provider:work or home office Persons participating in the virtual visit: patient, provider  I discussed the limitations of evaluation and management by telemedicine and the availability of in person appointments. The patient expressed understanding and agreed to proceed.  Chief Complaint  Patient presents with   Cough    Pt states she is coughing up clear-yellow mucous. States symptoms started x 2 days ago. Has worsened since then. SOB last night, but not currently. Congestion that is chronic. Still having issues since 11/08/21 that has been intermittent. Has not tested for Covid    HPI: Pt has h/o COPD, ADHD, seasonal allergies who was seen for acute concern.  Pt states nose became stuffed up a few nights ago with increased nasal drainange.  Pt developed a deep productive cough in the afternoon and HAs. Frontal sinus pressure also noted a few days ago.  Started having SOB last night.  Thought anxiety and laying down made it feel worse.  Felt better this am.  Denied fever, wheezing, chest pain.  Pt has not had to use the albuterol inhaler until last night.  Treated for sinusitis at the end of last month, unsure if symptoms completely resolved.  Weather change may have also played a role in things.  ROS: See pertinent positives and negatives per HPI.  Past Medical History:  Diagnosis Date   Allergy    Heart murmur     Past Surgical History:  Procedure Laterality Date   ABDOMINAL HYSTERECTOMY      Family History  Problem Relation Age of Onset   Arthritis Mother    Cancer Mother    Hearing loss Mother    Cancer Father    Depression Sister    Asthma Brother    Current Outpatient Medications:    albuterol (VENTOLIN  HFA) 108 (90 Base) MCG/ACT inhaler, Inhale 2 puffs into the lungs every 6 (six) hours as needed for wheezing or shortness of breath., Disp: 6.7 g, Rfl: 2   amphetamine-dextroamphetamine (ADDERALL) 10 MG tablet, Take 1 tablet (10 mg total) by mouth 2 (two) times daily., Disp: 60 tablet, Rfl: 0   amphetamine-dextroamphetamine (ADDERALL) 10 MG tablet, Take 1 tablet (10 mg total) by mouth 2 (two) times daily., Disp: 60 tablet, Rfl: 0   Azelastine HCl 137 MCG/SPRAY SOLN, SPRAY 1 TIME INTO EACH NOSTRIL TWICE A DAY, Disp: 30 mL, Rfl: 2   fluticasone (FLONASE) 50 MCG/ACT nasal spray, TAKE 2 SPRAYS IN EACH NOSTRIL EVERY DAY AT NIGHT, Disp: 16 mL, Rfl: 5  EXAM:  VITALS per patient if applicable: RR between 12-20 bpm  GENERAL: alert, oriented, appears well and in no acute distress  HEENT: atraumatic, conjunctiva clear, no obvious abnormalities on inspection of external nose and ears  NECK: normal movements of the head and neck  LUNGS: Intermittent productive cough on inspection no signs of respiratory distress, breathing rate appears normal, no obvious gross SOB, gasping or wheezing  CV: no obvious cyanosis  MS: moves all visible extremities without noticeable abnormality  PSYCH/NEURO: pleasant and cooperative, no obvious depression or anxiety, speech and thought processing grossly intact  ASSESSMENT AND PLAN:  Discussed the following assessment and plan:  Frontal sinusitis, unspecified chronicity  -Continue Flonase nasal spray.  Consider p.o. antihistamine such as Zyrtec,  Allegra, Xyzal, Claritin. -As symptoms may not have completely resolved after Augmentin course 1 month ago will start doxycycline. -Given precautions - Plan: doxycycline (VIBRA-TABS) 100 MG tablet  Acute cough -Discussed possible causes including viral respiratory infection, exacerbation of seasonal allergies, also consider COPD exacerbation. -Given history of COPD discussed starting doxycycline. -Discussed r/b/a of  doxycycline -Patient would like to avoid prednisone use if possible. -Continue albuterol inhaler as needed.  Inhaler refilled. -Continue supportive care with OTC cough/cold medication  Respiratory tract infection  - Plan: doxycycline (VIBRA-TABS) 100 MG tablet  History of COPD  - Plan: doxycycline (VIBRA-TABS) 100 MG tablet, albuterol (VENTOLIN HFA) 108 (90 Base) MCG/ACT inhaler  Given strict precautions.  For continued or worsening symptoms in person eval needed.   I discussed the assessment and treatment plan with the patient. The patient was provided an opportunity to ask questions and all were answered. The patient agreed with the plan and demonstrated an understanding of the instructions.   The patient was advised to call back or seek an in-person evaluation if the symptoms worsen or if the condition fails to improve as anticipated.   Billie Ruddy, MD

## 2022-02-02 ENCOUNTER — Telehealth: Payer: Self-pay | Admitting: Family Medicine

## 2022-02-02 ENCOUNTER — Other Ambulatory Visit: Payer: Self-pay | Admitting: Family Medicine

## 2022-02-02 DIAGNOSIS — J302 Other seasonal allergic rhinitis: Secondary | ICD-10-CM

## 2022-02-02 NOTE — Telephone Encounter (Signed)
Pt called to request a refill of the  amphetamine-dextroamphetamine (ADDERALL) 10 MG tablet  LOV:  05/26/21 LVV:  08/23/21  [id number] [place], [place] - [address] Phone: [phone]  Fax: [phone]     *Pt requested a call back once it is done.

## 2022-02-04 ENCOUNTER — Encounter: Payer: Self-pay | Admitting: Family Medicine

## 2022-02-04 DIAGNOSIS — F909 Attention-deficit hyperactivity disorder, unspecified type: Secondary | ICD-10-CM

## 2022-02-07 NOTE — Telephone Encounter (Signed)
Last visit was VV 12/13/21  Last CPE 05/17/21

## 2022-02-08 MED ORDER — AMPHETAMINE-DEXTROAMPHETAMINE 10 MG PO TABS: 10.0000 mg | ORAL_TABLET | Freq: Two times a day (BID) | ORAL | 0 refills | Status: DC

## 2022-02-08 NOTE — Addendum Note (Signed)
Addended by: Kathreen Devoid on: 02/08/2022 01:21 PM   Modules accepted: Orders

## 2022-02-08 NOTE — Addendum Note (Signed)
Addended by: Kristian Covey on: 02/08/2022 04:57 PM   Modules accepted: Orders

## 2022-02-16 NOTE — Telephone Encounter (Signed)
Med was refilled however patient needs an appointment every 3 months for this medication.

## 2022-02-17 NOTE — Telephone Encounter (Signed)
Left a detail message that rx was sent and to call us back to schedule a future appointment.

## 2022-02-23 ENCOUNTER — Telehealth (INDEPENDENT_AMBULATORY_CARE_PROVIDER_SITE_OTHER): Payer: 59 | Admitting: Family Medicine

## 2022-02-23 ENCOUNTER — Encounter: Payer: Self-pay | Admitting: Family Medicine

## 2022-02-23 DIAGNOSIS — F909 Attention-deficit hyperactivity disorder, unspecified type: Secondary | ICD-10-CM | POA: Diagnosis not present

## 2022-02-23 DIAGNOSIS — J3089 Other allergic rhinitis: Secondary | ICD-10-CM

## 2022-02-23 MED ORDER — AMPHETAMINE-DEXTROAMPHETAMINE 10 MG PO TABS: 10.0000 mg | ORAL_TABLET | Freq: Two times a day (BID) | ORAL | 0 refills | Status: DC

## 2022-02-23 MED ORDER — MONTELUKAST SODIUM 10 MG PO TABS: 10.0000 mg | ORAL_TABLET | Freq: Every day | ORAL | 3 refills | Status: DC

## 2022-02-23 NOTE — Progress Notes (Signed)
Virtual Visit via Video Note  I connected with Latoya Martinez on 02/23/22 at  2:00 PM EST by a video enabled telemedicine application 2/2 EXNTZ-00 pandemic and verified that I am speaking with the correct person using two identifiers.  Location patient: home Location provider:work or home office Persons participating in the virtual visit: patient, provider  I discussed the limitations of evaluation and management by telemedicine and the availability of in person appointments. The patient expressed understanding and agreed to proceed. Chief Complaint  Patient presents with   Follow-up    On medication check for adderall.      HPI: Pt seen fo f/u on ADHD. Doing well on Adderall 10 mg BID. Rx in Dec sent in by Dr. Elease Hashimoto as this provider out of office.    Pt had bronchitis over the holidays.  Unable to get an appt in clinic.  Seen by UC in [place].  Finishing course of doxycycline.  Pt hasn't had any wine in 3 wks.   ROS: See pertinent positives and negatives per HPI.  Past Medical History:  Diagnosis Date   Allergy    Heart murmur     Past Surgical History:  Procedure Laterality Date   ABDOMINAL HYSTERECTOMY      Family History  Problem Relation Age of Onset   Arthritis Mother    Cancer Mother    Hearing loss Mother    Cancer Father    Depression Sister    Asthma Brother     Current Outpatient Medications:    Azelastine HCl 137 MCG/SPRAY SOLN, SPRAY 1 TIME INTO EACH NOSTRIL TWICE A DAY, Disp: 30 mL, Rfl: 2   fluticasone (FLONASE) 50 MCG/ACT nasal spray, TAKE 2 SPRAYS IN EACH NOSTRIL EVERY DAY AT NIGHT, Disp: 16 mL, Rfl: 5   albuterol (VENTOLIN HFA) 108 (90 Base) MCG/ACT inhaler, Inhale 2 puffs into the lungs every 6 (six) hours as needed for wheezing or shortness of breath., Disp: 6.7 g, Rfl: 3   amphetamine-dextroamphetamine (ADDERALL) 10 MG tablet, Take 1 tablet (10 mg total) by mouth 2 (two) times daily., Disp: 60 tablet, Rfl: 0    amphetamine-dextroamphetamine (ADDERALL) 10 MG tablet, Take 1 tablet (10 mg total) by mouth 2 (two) times daily., Disp: 60 tablet, Rfl: 0  EXAM:  VITALS per patient if applicable:  RR between 12-20 bpm  GENERAL: alert, oriented, appears well and in no acute distress  HEENT: atraumatic, conjunctiva clear, no obvious abnormalities on inspection of external nose and ears  NECK: normal movements of the head and neck  LUNGS: on inspection no signs of respiratory distress, breathing rate appears normal, no obvious gross SOB, gasping or wheezing  CV: no obvious cyanosis  MS: moves all visible extremities without noticeable abnormality  PSYCH/NEURO: pleasant and cooperative, no obvious depression or anxiety, speech and thought processing grossly intact  ASSESSMENT AND PLAN:  Discussed the following assessment and plan:  Attention deficit hyperactivity disorder (ADHD), unspecified ADHD type  -stable -PDMP reviewed -continue adderall 10 mg BID - Plan: amphetamine-dextroamphetamine (ADDERALL) 10 MG tablet, amphetamine-dextroamphetamine (ADDERALL) 10 MG tablet, amphetamine-dextroamphetamine (ADDERALL) 10 MG tablet  Environmental and seasonal allergies  - Plan: montelukast (SINGULAIR) 10 MG tablet  F/u in 3 months    I discussed the assessment and treatment plan with the patient. The patient was provided an opportunity to ask questions and all were answered. The patient agreed with the plan and demonstrated an understanding of the instructions.   The patient was advised to call back or seek an  in-person evaluation if the symptoms worsen or if the condition fails to improve as anticipated.   Billie Ruddy, MD

## 2022-04-15 ENCOUNTER — Encounter: Payer: Self-pay | Admitting: Family Medicine

## 2022-04-15 ENCOUNTER — Telehealth (INDEPENDENT_AMBULATORY_CARE_PROVIDER_SITE_OTHER): Payer: 59 | Admitting: Family Medicine

## 2022-04-15 VITALS — Ht 67.0 in | Wt 121.0 lb

## 2022-04-15 DIAGNOSIS — Z8709 Personal history of other diseases of the respiratory system: Secondary | ICD-10-CM | POA: Diagnosis not present

## 2022-04-15 DIAGNOSIS — F419 Anxiety disorder, unspecified: Secondary | ICD-10-CM

## 2022-04-15 DIAGNOSIS — Z566 Other physical and mental strain related to work: Secondary | ICD-10-CM | POA: Diagnosis not present

## 2022-04-15 MED ORDER — ALBUTEROL SULFATE HFA 108 (90 BASE) MCG/ACT IN AERS: 2.0000 | INHALATION_SPRAY | Freq: Four times a day (QID) | RESPIRATORY_TRACT | 5 refills | Status: DC | PRN

## 2022-04-15 MED ORDER — HYDROXYZINE HCL 25 MG PO TABS: 25.0000 mg | ORAL_TABLET | Freq: Three times a day (TID) | ORAL | 1 refills | Status: DC | PRN

## 2022-04-15 NOTE — Progress Notes (Signed)
Virtual Visit via Video Note  I connected with Leota Jacobsen on 04/15/22 at  3:00 PM EST by a video enabled telemedicine application and verified that I am speaking with the correct person using two identifiers.  Location patient: home Location provider:work or home office Persons participating in the virtual visit: patient, provider  I discussed the limitations of evaluation and management by telemedicine and the availability of in person appointments. The patient expressed understanding and agreed to proceed.  Chief Complaint  Patient presents with   Follow-up    HPI: Pt is a 57 yo female with pmh sig for ADHD, allergies, COPD who is seen for acute concern.  Pt is under increased stress at work dealing with her boss.  Pt concerned about retaliation.  Pt endorses difficulty sleeping b/c of the stress.  Plans on retaining counsel regarding the matter.  Having to use albuterol inhaler more 2/2 increased anxiety affecting breathing.  Took old rx of hydroxyzine which helped.  Requesting refill.  May take one tab daily if needed.  Also caring for her best friend in [place] on [address] who was dx'd with tripple negative BCa.   ROS: See pertinent positives and negatives per HPI.  Past Medical History:  Diagnosis Date   Allergy    Heart murmur     Past Surgical History:  Procedure Laterality Date   ABDOMINAL HYSTERECTOMY      Family History  Problem Relation Age of Onset   Arthritis Mother    Cancer Mother    Hearing loss Mother    Cancer Father    Depression Sister    Asthma Brother      Current Outpatient Medications:    albuterol (VENTOLIN HFA) 108 (90 Base) MCG/ACT inhaler, Inhale 2 puffs into the lungs every 6 (six) hours as needed for wheezing or shortness of breath., Disp: 6.7 g, Rfl: 3   amphetamine-dextroamphetamine (ADDERALL) 10 MG tablet, Take 1 tablet (10 mg total) by mouth 2 (two) times daily., Disp: 60 tablet, Rfl: 0   amphetamine-dextroamphetamine (ADDERALL) 10  MG tablet, Take 1 tablet (10 mg total) by mouth 2 (two) times daily., Disp: 60 tablet, Rfl: 0   amphetamine-dextroamphetamine (ADDERALL) 10 MG tablet, Take 1 tablet (10 mg total) by mouth 2 (two) times daily., Disp: 60 tablet, Rfl: 0   Azelastine HCl 137 MCG/SPRAY SOLN, SPRAY 1 TIME INTO EACH NOSTRIL TWICE A DAY, Disp: 30 mL, Rfl: 2   fluticasone (FLONASE) 50 MCG/ACT nasal spray, TAKE 2 SPRAYS IN EACH NOSTRIL EVERY DAY AT NIGHT, Disp: 16 mL, Rfl: 5   montelukast (SINGULAIR) 10 MG tablet, Take 1 tablet (10 mg total) by mouth at bedtime., Disp: 90 tablet, Rfl: 3  EXAM:  VITALS per patient if applicable: RR between 123456 bpm  GENERAL: alert, oriented, appears well and in no acute distress  HEENT: atraumatic, conjunctiva clear, no obvious abnormalities on inspection of external nose and ears  NECK: normal movements of the head and neck  LUNGS: on inspection no signs of respiratory distress, breathing rate appears normal, no obvious gross SOB, gasping or wheezing  CV: no obvious cyanosis  MS: moves all visible extremities without noticeable abnormality  PSYCH/NEURO: pleasant and cooperative, no obvious depression or anxiety, speech and thought processing grossly intact  ASSESSMENT AND PLAN:  Discussed the following assessment and plan:  Work-related stress - Plan: hydrOXYzine (ATARAX) 25 MG tablet  Anxiety - Plan: hydrOXYzine (ATARAX) 25 MG tablet  History of COPD - Plan: albuterol (VENTOLIN HFA) 108 (90 Base) MCG/ACT  inhaler  Increased stress a/w work.  Restart hydroxyzine 25 mg TID prn.  Self care encouraged.  F/u prn   I discussed the assessment and treatment plan with the patient. The patient was provided an opportunity to ask questions and all were answered. The patient agreed with the plan and demonstrated an understanding of the instructions.   The patient was advised to call back or seek an in-person evaluation if the symptoms worsen or if the condition fails to improve as  anticipated.   Billie Ruddy, MD

## 2022-06-15 ENCOUNTER — Encounter: Payer: Self-pay | Admitting: Family Medicine

## 2022-06-15 ENCOUNTER — Telehealth (INDEPENDENT_AMBULATORY_CARE_PROVIDER_SITE_OTHER): Payer: 59 | Admitting: Family Medicine

## 2022-06-15 DIAGNOSIS — F909 Attention-deficit hyperactivity disorder, unspecified type: Secondary | ICD-10-CM | POA: Diagnosis not present

## 2022-06-15 MED ORDER — AMPHETAMINE-DEXTROAMPHETAMINE 10 MG PO TABS: 10.0000 mg | ORAL_TABLET | Freq: Two times a day (BID) | ORAL | 0 refills | Status: DC

## 2022-06-15 NOTE — Progress Notes (Signed)
Virtual Visit via Video Note  I connected with Latoya Martinez on 06/15/22 at 11:00 AM EDT by a video enabled telemedicine application and verified that I am speaking with the correct person using two identifiers.  Location patient: home Location provider:work or home office Persons participating in the virtual visit: patient, provider  I discussed the limitations of evaluation and management by telemedicine and the availability of in person appointments. The patient expressed understanding and agreed to proceed.   HPI: Pt is a 57 yo female seen for ADHD f/u.  Keeping busy with work.  Pt stable on current dose of adderall 10 mg BID.  Still travelling to [place] to care for her friend who was dx'd with cancer and to see her son.     ROS: See pertinent positives and negatives per HPI.  Past Medical History:  Diagnosis Date   Allergy    Heart murmur     Past Surgical History:  Procedure Laterality Date   ABDOMINAL HYSTERECTOMY      Family History  Problem Relation Age of Onset   Arthritis Mother    Cancer Mother    Hearing loss Mother    Cancer Father    Depression Sister    Asthma Brother      Current Outpatient Medications:    albuterol (VENTOLIN HFA) 108 (90 Base) MCG/ACT inhaler, Inhale 2 puffs into the lungs every 6 (six) hours as needed for wheezing or shortness of breath., Disp: 6.7 g, Rfl: 5   amphetamine-dextroamphetamine (ADDERALL) 10 MG tablet, Take 1 tablet (10 mg total) by mouth 2 (two) times daily., Disp: 60 tablet, Rfl: 0   amphetamine-dextroamphetamine (ADDERALL) 10 MG tablet, Take 1 tablet (10 mg total) by mouth 2 (two) times daily., Disp: 60 tablet, Rfl: 0   amphetamine-dextroamphetamine (ADDERALL) 10 MG tablet, Take 1 tablet (10 mg total) by mouth 2 (two) times daily., Disp: 60 tablet, Rfl: 0   Azelastine HCl 137 MCG/SPRAY SOLN, SPRAY 1 TIME INTO EACH NOSTRIL TWICE A DAY, Disp: 30 mL, Rfl: 2   fluticasone (FLONASE) 50 MCG/ACT nasal spray, TAKE 2 SPRAYS IN  EACH NOSTRIL EVERY DAY AT NIGHT, Disp: 16 mL, Rfl: 5   hydrOXYzine (ATARAX) 25 MG tablet, Take 1 tablet (25 mg total) by mouth 3 (three) times daily as needed for anxiety., Disp: 90 tablet, Rfl: 1   montelukast (SINGULAIR) 10 MG tablet, Take 1 tablet (10 mg total) by mouth at bedtime., Disp: 90 tablet, Rfl: 3  EXAM:  VITALS per patient if applicable:  RR between 12-20 bpm  GENERAL: alert, oriented, appears well and in no acute distress  HEENT: atraumatic, conjunctiva clear, no obvious abnormalities on inspection of external nose and ears  NECK: normal movements of the head and neck  LUNGS: on inspection no signs of respiratory distress, breathing rate appears normal, no obvious gross SOB, gasping or wheezing  CV: no obvious cyanosis  MS: moves all visible extremities without noticeable abnormality  PSYCH/NEURO: pleasant and cooperative, no obvious depression or anxiety, speech and thought processing grossly intact  ASSESSMENT AND PLAN:  Discussed the following assessment and plan:  Attention deficit hyperactivity disorder (ADHD), unspecified ADHD type  -stable -continue current dose -PDMP reviewed - Plan: amphetamine-dextroamphetamine (ADDERALL) 10 MG tablet, amphetamine-dextroamphetamine (ADDERALL) 10 MG tablet, amphetamine-dextroamphetamine (ADDERALL) 10 MG tablet  F/u in 3 months for refills.  Appt made for 07/18/22 at 1pm for CPE.   I discussed the assessment and treatment plan with the patient. The patient was provided an opportunity to ask  questions and all were answered. The patient agreed with the plan and demonstrated an understanding of the instructions.   The patient was advised to call back or seek an in-person evaluation if the symptoms worsen or if the condition fails to improve as anticipated.   [name], MD

## 2022-06-17 ENCOUNTER — Other Ambulatory Visit: Payer: Self-pay | Admitting: Family Medicine

## 2022-06-17 DIAGNOSIS — Z566 Other physical and mental strain related to work: Secondary | ICD-10-CM

## 2022-06-17 DIAGNOSIS — F419 Anxiety disorder, unspecified: Secondary | ICD-10-CM

## 2022-07-14 ENCOUNTER — Other Ambulatory Visit: Payer: Self-pay | Admitting: Family Medicine

## 2022-07-14 DIAGNOSIS — F419 Anxiety disorder, unspecified: Secondary | ICD-10-CM

## 2022-07-14 DIAGNOSIS — Z566 Other physical and mental strain related to work: Secondary | ICD-10-CM

## 2022-07-18 ENCOUNTER — Encounter: Payer: Self-pay | Admitting: Family Medicine

## 2022-07-18 ENCOUNTER — Ambulatory Visit (INDEPENDENT_AMBULATORY_CARE_PROVIDER_SITE_OTHER): Payer: 59 | Admitting: Family Medicine

## 2022-07-18 VITALS — BP 130/84 | HR 100 | Temp 97.9°F | Ht 66.0 in | Wt 122.6 lb

## 2022-07-18 DIAGNOSIS — J3089 Other allergic rhinitis: Secondary | ICD-10-CM | POA: Diagnosis not present

## 2022-07-18 DIAGNOSIS — Z1211 Encounter for screening for malignant neoplasm of colon: Secondary | ICD-10-CM

## 2022-07-18 DIAGNOSIS — D225 Melanocytic nevi of trunk: Secondary | ICD-10-CM

## 2022-07-18 DIAGNOSIS — D492 Neoplasm of unspecified behavior of bone, soft tissue, and skin: Secondary | ICD-10-CM | POA: Diagnosis not present

## 2022-07-18 DIAGNOSIS — Z566 Other physical and mental strain related to work: Secondary | ICD-10-CM

## 2022-07-18 DIAGNOSIS — L821 Other seborrheic keratosis: Secondary | ICD-10-CM

## 2022-07-18 DIAGNOSIS — Z0000 Encounter for general adult medical examination without abnormal findings: Secondary | ICD-10-CM | POA: Diagnosis not present

## 2022-07-18 DIAGNOSIS — Z1231 Encounter for screening mammogram for malignant neoplasm of breast: Secondary | ICD-10-CM

## 2022-07-18 LAB — COMPREHENSIVE METABOLIC PANEL
ALT: 22 U/L (ref 0–35)
AST: 29 U/L (ref 0–37)
Albumin: 4.4 g/dL (ref 3.5–5.2)
Alkaline Phosphatase: 78 U/L (ref 39–117)
BUN: 18 mg/dL (ref 6–23)
CO2: 26 mEq/L (ref 19–32)
Calcium: 9.5 mg/dL (ref 8.4–10.5)
Chloride: 105 mEq/L (ref 96–112)
Creatinine, Ser: 0.86 mg/dL (ref 0.40–1.20)
GFR: 75.26 mL/min (ref >60.00)
Glucose, Bld: 75 mg/dL (ref 70–99)
Potassium: 4.2 mEq/L (ref 3.5–5.1)
Sodium: 138 mEq/L (ref 135–145)
Total Bilirubin: 0.6 mg/dL (ref 0.2–1.2)
Total Protein: 6.8 g/dL (ref 6.0–8.3)

## 2022-07-18 LAB — CBC WITH DIFFERENTIAL/PLATELET
Basophils Absolute: 0.1 10*3/uL (ref 0.0–0.1)
Basophils Relative: 1 % (ref 0.0–3.0)
Eosinophils Absolute: 0.1 10*3/uL (ref 0.0–0.7)
Eosinophils Relative: 1.4 % (ref 0.0–5.0)
HCT: 45.1 % (ref 36.0–46.0)
Hemoglobin: 14.9 g/dL (ref 12.0–15.0)
Lymphocytes Relative: 27.2 % (ref 12.0–46.0)
Lymphs Abs: 1.9 10*3/uL (ref 0.7–4.0)
MCHC: 33.1 g/dL (ref 30.0–36.0)
MCV: 98.4 fl (ref 78.0–100.0)
Monocytes Absolute: 0.6 10*3/uL (ref 0.1–1.0)
Monocytes Relative: 7.9 % (ref 3.0–12.0)
Neutro Abs: 4.5 10*3/uL (ref 1.4–7.7)
Neutrophils Relative %: 62.5 % (ref 43.0–77.0)
Platelets: 323 10*3/uL (ref 150.0–400.0)
RBC: 4.59 Mil/uL (ref 3.87–5.11)
RDW: 12.9 % (ref 11.5–15.5)
WBC: 7.1 10*3/uL (ref 4.0–10.5)

## 2022-07-18 LAB — LIPID PANEL
Cholesterol: 177 mg/dL (ref 0–200)
HDL: 70 mg/dL (ref >39.00)
LDL Cholesterol: 87 mg/dL (ref 0–99)
NonHDL: 106.67
Total CHOL/HDL Ratio: 3
Triglycerides: 98 mg/dL (ref 0.0–149.0)
VLDL: 19.6 mg/dL (ref 0.0–40.0)

## 2022-07-18 LAB — T4, FREE: Free T4: 0.78 ng/dL (ref 0.60–1.60)

## 2022-07-18 LAB — HEMOGLOBIN A1C: Hgb A1c MFr Bld: 5.4 % (ref 4.6–6.5)

## 2022-07-18 LAB — TSH: TSH: 0.79 u[IU]/mL (ref 0.35–5.50)

## 2022-07-18 NOTE — Patient Instructions (Addendum)
A referral was placed for dermatology specialist of [facility] located on [facility], gastroenterologist, and for your mammogram.  You can expect phone calls about scheduling these appointments.  For the mammogram you can contact the breast center at [facility] imaging at [phone].

## 2022-07-18 NOTE — Progress Notes (Signed)
Established Patient Office Visit   Subjective  Patient ID: Latoya Martinez, female    DOB: 1965/11/06  Age: 57 y.o. MRN: [id number]  Chief Complaint  Patient presents with   Annual Exam    Patient is a 57 year old female seen for CPE and follow-up.  Patient states she has been doing well overall.  Dealing with some stress at work which is affecting her BP, mental health, and overall happiness.  Has seen some improvements on the job.  Patient still working out regularly.  Patient taking Flonase and Singulair as needed for seasonal and environmental allergies.  Endorses coughing due to allergies.  Patient considering Pap.  Has noticed a skin lesion on right upper arm present times months but will not heal.  At times has flaky dry appearance.  Also with a lesion on upper back, and left forehead near hairline.    Past Medical History:  Diagnosis Date   Allergy    Heart murmur    Past Surgical History:  Procedure Laterality Date   ABDOMINAL HYSTERECTOMY     Social History   Tobacco Use   Smoking status: Every Day   Smokeless tobacco: Current  Substance Use Topics   Alcohol use: No   Drug use: No   Family History  Problem Relation Age of Onset   Arthritis Mother    Cancer Mother    Hearing loss Mother    Cancer Father    Depression Sister    Asthma Brother    No Known Allergies    ROS Negative unless stated above    Objective:     BP 130/84 (BP Location: Right Arm, Patient Position: Sitting, Cuff Size: Normal)   Pulse 100   Temp 97.9 F (36.6 C) (Oral)   Ht 5\' 6"  (1.676 m)   Wt 122 lb 9.6 oz (55.6 kg)   SpO2 96%   BMI 19.79 kg/m    Physical Exam Constitutional:      Appearance: Normal appearance.  HENT:     Head: Normocephalic and atraumatic.     Right Ear: Tympanic membrane, ear canal and external ear normal.     Left Ear: Tympanic membrane, ear canal and external ear normal.     Nose: Nose normal.     Mouth/Throat:     Mouth: Mucous membranes  are moist.     Pharynx: No oropharyngeal exudate or posterior oropharyngeal erythema.  Eyes:     General: No scleral icterus.    Extraocular Movements: Extraocular movements intact.     Conjunctiva/sclera: Conjunctivae normal.     Pupils: Pupils are equal, round, and reactive to light.  Neck:     Thyroid: No thyromegaly.  Cardiovascular:     Rate and Rhythm: Normal rate and regular rhythm.     Pulses: Normal pulses.     Heart sounds: Normal heart sounds. No murmur heard.    No friction rub.  Pulmonary:     Effort: Pulmonary effort is normal.     Breath sounds: Normal breath sounds. No wheezing, rhonchi or rales.  Abdominal:     General: Bowel sounds are normal.     Palpations: Abdomen is soft.     Tenderness: There is no abdominal tenderness.  Musculoskeletal:        General: No deformity. Normal range of motion.  Lymphadenopathy:     Cervical: No cervical adenopathy.  Skin:    General: Skin is warm and dry.     Findings: No lesion.  Comments: Erythematous nodular area on the right upper arm 8 mm in size.  Whitish clear nodule on R upper arm.  Actinic keratosis left forehead near hairline.  2 nevi in close proximity on upper back to the left of midline with irregular shape and color.  Neurological:     General: No focal deficit present.     Mental Status: She is alert and oriented to person, place, and time.  Psychiatric:        Mood and Affect: Mood normal.        Thought Content: Thought content normal.        07/18/2022    1:12 PM 12/13/2021   11:32 AM 08/23/2021    4:05 PM 04/16/2020   12:03 PM 04/13/2018    2:18 PM  Depression screen PHQ 2/9  Decreased Interest 0 0 0 0 1  Down, Depressed, Hopeless 0 0 0 0 1  PHQ - 2 Score 0 0 0 0 2  Altered sleeping 0  0 0 1  Tired, decreased energy 1  1 1 2   Change in appetite 0  0 0 2  Feeling bad or failure about yourself  0  0 0 0  Trouble concentrating 0  0 0 2  Moving slowly or fidgety/restless 0  0 0 2  Suicidal  thoughts 0  0 0 0  PHQ-9 Score 1  1 1 11   Difficult doing work/chores Not difficult at all   Not difficult at all Somewhat difficult      07/18/2022    1:12 PM 04/16/2020   12:03 PM 04/13/2018    2:19 PM  GAD 7 : Generalized Anxiety Score  Nervous, Anxious, on Edge 1 1 2   Control/stop worrying 0 1 2  Worry too much - different things 0 1 2  Trouble relaxing 0 1 2  Restless 0 1 2  Easily annoyed or irritable 0 0 2  Afraid - awful might happen 0 0 1  Total GAD 7 Score 1 5 13   Anxiety Difficulty Somewhat difficult Somewhat difficult Somewhat difficult     No results found for any visits on 07/18/22.    Assessment & Plan:  Well adult exam -Age-appropriate screenings discussed -Pap encouraged -Immunizations reviewed -Colonoscopy referral placed -Mammogram ordered this visit -Neck CPE in 1 year. -     Comprehensive metabolic panel -     CBC with Differential/Platelet -     TSH -     T4, free -     Hemoglobin A1c -     Lipid panel  Screen for colon cancer -     Ambulatory referral to Gastroenterology  Encounter for screening mammogram for malignant neoplasm of breast -     Digital Screening Mammogram, Left and Right; Future  Neoplasm of skin of upper arm -Concern for malignancy.  Refer to Derm for removal -Discussed wearing protective clothing, limiting sun exposure, wearing sunscreen when outdoors. -     Ambulatory referral to Dermatology  Atypical nevus of back -     Ambulatory referral to Dermatology  Seborrheic keratosis -     Ambulatory referral to Dermatology  Environmental and seasonal allergies -Continue Flonase and Singulair  Work-related stress -Continue self-care  Return if symptoms worsen or fail to improve.   [name], MD

## 2022-07-20 ENCOUNTER — Encounter: Payer: Self-pay | Admitting: Family Medicine

## 2022-07-22 ENCOUNTER — Encounter: Payer: Self-pay | Admitting: Family Medicine

## 2022-07-22 NOTE — Telephone Encounter (Addendum)
Pt states her DOB is wrong on this referral.  Pt says her DOB is [birth year] and it needs to be corrected asap!!

## 2022-07-25 ENCOUNTER — Encounter: Payer: Self-pay | Admitting: Family Medicine

## 2022-07-25 NOTE — Telephone Encounter (Signed)
Attempted to reach pt. Left a voicemail to call back.  

## 2022-07-28 NOTE — Telephone Encounter (Signed)
Note from referral shows appointment scheduled.   08/27 appt scheduled for 11:30am.

## 2022-08-03 ENCOUNTER — Telehealth (INDEPENDENT_AMBULATORY_CARE_PROVIDER_SITE_OTHER): Payer: 59 | Admitting: Family Medicine

## 2022-08-03 ENCOUNTER — Encounter: Payer: Self-pay | Admitting: Family Medicine

## 2022-08-03 DIAGNOSIS — J0140 Acute pansinusitis, unspecified: Secondary | ICD-10-CM

## 2022-08-03 DIAGNOSIS — H6690 Otitis media, unspecified, unspecified ear: Secondary | ICD-10-CM

## 2022-08-03 MED ORDER — CEFDINIR 300 MG PO CAPS
300.0000 mg | ORAL_CAPSULE | Freq: Two times a day (BID) | ORAL | 0 refills | Status: DC
Start: 2022-08-03 — End: 2022-08-19

## 2022-08-03 NOTE — Progress Notes (Signed)
Virtual Visit via Video Note  I connected with Latoya Martinez on 08/03/22 at  1:30 PM EDT by a video enabled telemedicine application and verified that I am speaking with the correct person using two identifiers.  Location patient: home Location provider:work or home office Persons participating in the virtual visit: patient, provider  I discussed the limitations of evaluation and management by telemedicine and the availability of in person appointments. The patient expressed understanding and agreed to proceed.  Chief Complaint  Patient presents with   Sinus Problem    Sinus and ear congestion. Some sinus mucus. UC 10 days ago, amoxicilian did not work. Both ears congested.   ' HPI: Pt seen at UC 11 days ago for sinusitis, bronchitis, and b/l ear infection.  Took 10 days of amoxicillin, but still having symptoms just not as intense.  Having pain in L side of forehead, L cheek, R cheek with congestion.  R ear is starting to feel full again.  Bronchitis symptoms have improved.   ROS: See pertinent positives and negatives per HPI.  Past Medical History:  Diagnosis Date   Allergy    Heart murmur     Past Surgical History:  Procedure Laterality Date   ABDOMINAL HYSTERECTOMY      Family History  Problem Relation Age of Onset   Arthritis Mother    Cancer Mother    Hearing loss Mother    Cancer Father    Depression Sister    Asthma Brother      Current Outpatient Medications:    albuterol (VENTOLIN HFA) 108 (90 Base) MCG/ACT inhaler, Inhale 2 puffs into the lungs every 6 (six) hours as needed for wheezing or shortness of breath., Disp: 6.7 g, Rfl: 5   amphetamine-dextroamphetamine (ADDERALL) 10 MG tablet, Take 1 tablet (10 mg total) by mouth 2 (two) times daily., Disp: 60 tablet, Rfl: 0   amphetamine-dextroamphetamine (ADDERALL) 10 MG tablet, Take 1 tablet (10 mg total) by mouth 2 (two) times daily., Disp: 60 tablet, Rfl: 0   amphetamine-dextroamphetamine (ADDERALL) 10 MG  tablet, Take 1 tablet (10 mg total) by mouth 2 (two) times daily., Disp: 60 tablet, Rfl: 0   Azelastine HCl 137 MCG/SPRAY SOLN, SPRAY 1 TIME INTO EACH NOSTRIL TWICE A DAY, Disp: 30 mL, Rfl: 2   fluticasone (FLONASE) 50 MCG/ACT nasal spray, TAKE 2 SPRAYS IN EACH NOSTRIL EVERY DAY AT NIGHT, Disp: 16 mL, Rfl: 5   hydrOXYzine (ATARAX) 25 MG tablet, TAKE 1 TABLET BY MOUTH THREE TIMES A DAY AS NEEDED FOR ANXIETY, Disp: 270 tablet, Rfl: 0   montelukast (SINGULAIR) 10 MG tablet, Take 1 tablet (10 mg total) by mouth at bedtime., Disp: 90 tablet, Rfl: 3  EXAM:  VITALS per patient if applicable:  RR between 12-20 bpm  GENERAL: alert, oriented, appears well and in no acute distress  HEENT: atraumatic, conjunctiva clear, no obvious abnormalities on inspection of external nose and ears  NECK: normal movements of the head and neck  LUNGS: on inspection no signs of respiratory distress, breathing rate appears normal, no obvious gross SOB, gasping or wheezing  CV: no obvious cyanosis  MS: moves all visible extremities without noticeable abnormality  PSYCH/NEURO: pleasant and cooperative, no obvious depression or anxiety, speech and thought processing grossly intact  ASSESSMENT AND PLAN:  Discussed the following assessment and plan:  Subacute pansinusitis - Plan: cefdinir (OMNICEF) 300 MG capsule  Acute otitis media, unspecified otitis media type - Plan: cefdinir (OMNICEF) 300 MG capsule  Failed Amox treatment for  AOM and Sinusitis.  Start Cefdinir.  Continue supportive care. Given strict precautions.   I discussed the assessment and treatment plan with the patient. The patient was provided an opportunity to ask questions and all were answered. The patient agreed with the plan and demonstrated an understanding of the instructions.   The patient was advised to call back or seek an in-person evaluation if the symptoms worsen or if the condition fails to improve as anticipated.   [name],  MD

## 2022-08-13 ENCOUNTER — Other Ambulatory Visit: Payer: Self-pay | Admitting: Family Medicine

## 2022-08-13 DIAGNOSIS — J302 Other seasonal allergic rhinitis: Secondary | ICD-10-CM

## 2022-08-19 ENCOUNTER — Telehealth (INDEPENDENT_AMBULATORY_CARE_PROVIDER_SITE_OTHER): Payer: 59 | Admitting: Family Medicine

## 2022-08-19 ENCOUNTER — Encounter: Payer: Self-pay | Admitting: Family Medicine

## 2022-08-19 DIAGNOSIS — J0141 Acute recurrent pansinusitis: Secondary | ICD-10-CM

## 2022-08-19 MED ORDER — DOXYCYCLINE HYCLATE 100 MG PO TABS
100.0000 mg | ORAL_TABLET | Freq: Two times a day (BID) | ORAL | 0 refills | Status: AC
Start: 2022-08-19 — End: 2022-08-29

## 2022-08-19 NOTE — Progress Notes (Signed)
Virtual Visit via Video Note  I connected with Latoya Martinez on 08/19/22 at  1:30 PM EDT by a video enabled telemedicine application and verified that I am speaking with the correct person using two identifiers.  Location patient: home Location provider:work or home office Persons participating in the virtual visit: patient, provider  I discussed the limitations of evaluation and management by telemedicine and the availability of in person appointments. The patient expressed understanding and agreed to proceed.  Chief Complaint  Patient presents with   Medical Management of Chronic Issues    She was okay for a few days, but last few days she has been feeling congested in the ears.    HPI: Today [birth year] patient's birthday.  Pt with continued sinus issues.  Seen by UC  given amox which did not help.  Pt completed a round of cefdinir 08/03/22 by this provider for continued sinusitis.  Felt better, then symptoms returned a few days later.  Pt currently with L ear pain , nasal congestion, dull headache, pain in face, facial edema.  States painful to apply make up to face.  Drainage increases when laying down at night causing cough.  Denies fever.    Pt in town for the next few wks for work.  ROS: See pertinent positives and negatives per HPI.  Past Medical History:  Diagnosis Date   Allergy    Heart murmur     Past Surgical History:  Procedure Laterality Date   ABDOMINAL HYSTERECTOMY      Family History  Problem Relation Age of Onset   Arthritis Mother    Cancer Mother    Hearing loss Mother    Cancer Father    Depression Sister    Asthma Brother       Current Outpatient Medications:    albuterol (VENTOLIN HFA) 108 (90 Base) MCG/ACT inhaler, Inhale 2 puffs into the lungs every 6 (six) hours as needed for wheezing or shortness of breath., Disp: 6.7 g, Rfl: 5   amphetamine-dextroamphetamine (ADDERALL) 10 MG tablet, Take 1 tablet (10 mg total) by mouth 2 (two) times daily., Disp: 60  tablet, Rfl: 0   amphetamine-dextroamphetamine (ADDERALL) 10 MG tablet, Take 1 tablet (10 mg total) by mouth 2 (two) times daily., Disp: 60 tablet, Rfl: 0   amphetamine-dextroamphetamine (ADDERALL) 10 MG tablet, Take 1 tablet (10 mg total) by mouth 2 (two) times daily., Disp: 60 tablet, Rfl: 0   Azelastine HCl 137 MCG/SPRAY SOLN, SPRAY 1 TIME INTO EACH NOSTRIL TWICE A DAY, Disp: 30 mL, Rfl: 2   cefdinir (OMNICEF) 300 MG capsule, Take 1 capsule (300 mg total) by mouth 2 (two) times daily., Disp: 14 capsule, Rfl: 0   fluticasone (FLONASE) 50 MCG/ACT nasal spray, TAKE 2 SPRAYS IN EACH NOSTRIL EVERY DAY AT NIGHT, Disp: 16 mL, Rfl: 5   hydrOXYzine (ATARAX) 25 MG tablet, TAKE 1 TABLET BY MOUTH THREE TIMES A DAY AS NEEDED FOR ANXIETY, Disp: 270 tablet, Rfl: 0   montelukast (SINGULAIR) 10 MG tablet, Take 1 tablet (10 mg total) by mouth at bedtime., Disp: 90 tablet, Rfl: 3  EXAM:  VITALS per patient if applicable: RR between 12-20 bpm  GENERAL: alert, oriented, appears well and in no acute distress  HEENT: atraumatic, conjunctiva clear, no obvious abnormalities on inspection of external nose and ears  NECK: normal movements of the head and neck  LUNGS: on inspection no signs of respiratory distress, breathing rate appears normal, no obvious gross SOB, gasping or wheezing  CV: no  obvious cyanosis  MS: moves all visible extremities without noticeable abnormality  PSYCH/NEURO: pleasant and cooperative, no obvious depression or anxiety, speech and thought processing grossly intact  ASSESSMENT AND PLAN:  Discussed the following assessment and plan:  Acute recurrent pansinusitis - Plan: doxycycline (VIBRA-TABS) 100 MG tablet  Start doxycycline for recurrent pansinusitis as failed courses of amoxicillin and cefdinir.  Continue supportive care with nasal spray, allergy medications.  For continued or worsening symptoms follow-up with ENT.   I discussed the assessment and treatment plan with the  patient. The patient was provided an opportunity to ask questions and all were answered. The patient agreed with the plan and demonstrated an understanding of the instructions.   The patient was advised to call back or seek an in-person evaluation if the symptoms worsen or if the condition fails to improve as anticipated.   [name], MD

## 2022-09-08 ENCOUNTER — Telehealth: Payer: Self-pay | Admitting: Family Medicine

## 2022-09-08 NOTE — Telephone Encounter (Signed)
Prescription Request  09/08/2022  LOV: 07/18/2022  What is the name of the medication or equipment? amphetamine-dextroamphetamine (ADDERALL) 10 MG tablet ave you contacted your pharmacy to request a refill? Yes   Which pharmacy would you like this sent to?  [id number] - [name], [place] - [address] Phone: [phone]  Fax: [phone]     Patient notified that their request is being sent to the clinical staff for review and that they should receive a response within 2 business days.   Please advise at Mobile [phone] (mobile)

## 2022-09-09 NOTE — Telephone Encounter (Signed)
Pt would like to pick up medication on Monday 09-12-2022

## 2022-09-12 ENCOUNTER — Ambulatory Visit
Admission: RE | Admit: 2022-09-12 | Discharge: 2022-09-12 | Disposition: A | Discharge status: Home / Self Care | Payer: 59 | Source: Ambulatory Visit | Attending: Family Medicine | Admitting: Family Medicine

## 2022-09-12 ENCOUNTER — Other Ambulatory Visit: Payer: Self-pay | Admitting: Family Medicine

## 2022-09-12 ENCOUNTER — Telehealth: Payer: Self-pay | Admitting: Family Medicine

## 2022-09-12 DIAGNOSIS — Z1231 Encounter for screening mammogram for malignant neoplasm of breast: Secondary | ICD-10-CM

## 2022-09-12 DIAGNOSIS — F909 Attention-deficit hyperactivity disorder, unspecified type: Secondary | ICD-10-CM

## 2022-09-12 MED ORDER — AMPHETAMINE-DEXTROAMPHETAMINE 10 MG PO TABS: 10.0000 mg | ORAL_TABLET | Freq: Two times a day (BID) | ORAL | 0 refills | Status: DC

## 2022-09-12 NOTE — Telephone Encounter (Signed)
Pt call back and stated she want her refill sent to  [id number] [place], [place] - [address] AT [facility] OF [address] Phone: [phone]  Fax: [phone]

## 2022-09-12 NOTE — Telephone Encounter (Signed)
Per state and federal laws, controlled substances require q 3 mo f/u.   A courtesy refill was sent to pt's pharmacy in [place] for 30 days.  Schedule f/u for further refills.

## 2022-09-12 NOTE — Telephone Encounter (Signed)
Pt called to ask why her Rx has still not been sent in?  Pt states she has been calling since Thursday.   Pt states MD knows how much she travels and how much she needs her refills.   Pt said she is going to the pharmacy in 10 minutes and would like to pick up her refills.   It is now 1:48 pm.   amphetamine-dextroamphetamine amphetamine-dextroamphetamine (ADDERALL) 10 MG tablet

## 2022-09-12 NOTE — Telephone Encounter (Signed)
Pt is travelling today and would like medication today please

## 2022-09-15 NOTE — Telephone Encounter (Signed)
Spoke to pt. Pt reports she already pick Rx up and has upcoming visit this month with Dr. Salomon Fick.

## 2022-09-23 NOTE — Telephone Encounter (Signed)
error 

## 2022-10-07 ENCOUNTER — Encounter: Payer: Self-pay | Admitting: Family Medicine

## 2022-10-07 ENCOUNTER — Telehealth: Payer: 59 | Admitting: Family Medicine

## 2022-10-07 DIAGNOSIS — F909 Attention-deficit hyperactivity disorder, unspecified type: Secondary | ICD-10-CM

## 2022-10-07 DIAGNOSIS — J3089 Other allergic rhinitis: Secondary | ICD-10-CM

## 2022-10-07 MED ORDER — FLUTICASONE PROPIONATE 50 MCG/ACT NA SUSP
NASAL | 5 refills | Status: DC
Start: 2022-10-07 — End: 2023-03-09

## 2022-10-07 MED ORDER — AMPHETAMINE-DEXTROAMPHETAMINE 10 MG PO TABS
10.0000 mg | ORAL_TABLET | Freq: Two times a day (BID) | ORAL | 0 refills | Status: DC
Start: 2022-10-07 — End: 2023-01-05

## 2022-10-07 MED ORDER — AMPHETAMINE-DEXTROAMPHETAMINE 10 MG PO TABS: 10.0000 mg | ORAL_TABLET | Freq: Two times a day (BID) | ORAL | 0 refills | Status: DC

## 2022-10-07 MED ORDER — MONTELUKAST SODIUM 10 MG PO TABS
10.0000 mg | ORAL_TABLET | Freq: Every day | ORAL | 3 refills | Status: DC
Start: 2022-10-07 — End: 2023-09-04

## 2022-10-07 NOTE — Progress Notes (Signed)
Virtual Visit via Video Note  I connected with Latoya Martinez on 10/07/22 at 11:30 AM EDT by a video enabled telemedicine application and verified that I am speaking with the correct person using two identifiers.  Location patient: home Location provider:work or home office Persons participating in the virtual visit: patient, provider  I discussed the limitations of evaluation and management by telemedicine and the availability of in person appointments. The patient expressed understanding and agreed to proceed.  Chief Complaint  Patient presents with   Medical Management of Chronic Issues    HPI: Pt  is a 57 yo female seen for medication management and f/u of chronic issues.  Pt doing well.  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    Seen for 3 mo ADHD f/u and med refill.  Doing well on Adderall 10 mg BID.  Needs refill.  No issues with sleep, appetite, or energy.  Pt saw Derm in [place] due to prolonged wait here.  Pt had 3 skin lesions removed.  States 2 were seborrheic keratosis and one was "basal something", but states she was told it was not cancer when called with the results.  Has a f/u on Oct 2nd.  Patient requesting refills on Singulair and Flonase.  States she is completely out of the nasal spray and may have a few pills left of Singulair.  Patient had refills on both medications here at local pharmacy in [place] however currently at her place in [place], [place].  ROS: See pertinent positives and negatives per HPI.  Past Medical History:  Diagnosis Date   Allergy    Heart murmur     Past Surgical History:  Procedure Laterality Date   ABDOMINAL HYSTERECTOMY     AUGMENTATION MAMMAPLASTY Bilateral     Family History  Problem Relation Age of Onset   Arthritis Mother    Cancer Mother    Hearing loss Mother    Cancer Father    Depression Sister    Asthma Brother      Current Outpatient Medications:    albuterol (VENTOLIN HFA) 108  (90 Base) MCG/ACT inhaler, Inhale 2 puffs into the lungs every 6 (six) hours as needed for wheezing or shortness of breath., Disp: 6.7 g, Rfl: 5   amphetamine-dextroamphetamine (ADDERALL) 10 MG tablet, Take 1 tablet (10 mg total) by mouth 2 (two) times daily., Disp: 60 tablet, Rfl: 0   amphetamine-dextroamphetamine (ADDERALL) 10 MG tablet, Take 1 tablet (10 mg total) by mouth 2 (two) times daily., Disp: 60 tablet, Rfl: 0   amphetamine-dextroamphetamine (ADDERALL) 10 MG tablet, Take 1 tablet (10 mg total) by mouth 2 (two) times daily., Disp: 60 tablet, Rfl: 0   Azelastine HCl 137 MCG/SPRAY SOLN, SPRAY 1 TIME INTO EACH NOSTRIL TWICE A DAY, Disp: 30 mL, Rfl: 2   fluticasone (FLONASE) 50 MCG/ACT nasal spray, TAKE 2 SPRAYS IN EACH NOSTRIL EVERY DAY AT NIGHT, Disp: 16 mL, Rfl: 5   hydrOXYzine (ATARAX) 25 MG tablet, TAKE 1 TABLET BY MOUTH THREE TIMES A DAY AS NEEDED FOR ANXIETY, Disp: 270 tablet, Rfl: 0   montelukast (SINGULAIR) 10 MG tablet, Take 1 tablet (10 mg total) by mouth at bedtime., Disp: 90 tablet, Rfl: 3  EXAM:  VITALS per patient if applicable:  RR between 12-20 bpm  GENERAL: alert, oriented, appears well and in no acute distress  HEENT: atraumatic, conjunctiva clear, no obvious abnormalities on inspection of external nose and ears  NECK: normal movements of the head and neck  LUNGS: on inspection no signs of respiratory distress, breathing rate appears normal, no obvious gross SOB, gasping or wheezing  CV: no obvious cyanosis  MS: moves all visible extremities without noticeable abnormality  PSYCH/NEURO: pleasant and cooperative, no obvious depression or anxiety, speech and thought processing grossly intact  ASSESSMENT AND PLAN:  Discussed the following assessment and plan:  Attention deficit hyperactivity disorder (ADHD), unspecified ADHD type -Stable on Adderall 10 mg twice daily. -79-month refills provided.  Sent to patient's pharmacy in [place], [place]. -PDMP reviewed and  appropriate.  - Plan: amphetamine-dextroamphetamine (ADDERALL) 10 MG tablet, amphetamine-dextroamphetamine (ADDERALL) 10 MG tablet, amphetamine-dextroamphetamine (ADDERALL) 10 MG tablet  Environmental and seasonal allergies -Flonase and Singulair sent to patient's local pharmacy in [place].  - Plan: fluticasone (FLONASE) 50 MCG/ACT nasal spray, montelukast (SINGULAIR) 10 MG tablet  Follow-up in 3 months for ADHD med refills.   I discussed the assessment and treatment plan with the patient. The patient was provided an opportunity to ask questions and all were answered. The patient agreed with the plan and demonstrated an understanding of the instructions.   The patient was advised to call back or seek an in-person evaluation if the symptoms worsen or if the condition fails to improve as anticipated.   [name], MD

## 2022-10-11 ENCOUNTER — Other Ambulatory Visit: Payer: Self-pay | Admitting: Family Medicine

## 2022-10-11 DIAGNOSIS — Z566 Other physical and mental strain related to work: Secondary | ICD-10-CM

## 2022-10-11 DIAGNOSIS — F419 Anxiety disorder, unspecified: Secondary | ICD-10-CM

## 2022-11-11 ENCOUNTER — Telehealth: Payer: Self-pay | Admitting: Family Medicine

## 2022-11-11 NOTE — Telephone Encounter (Signed)
Prescription Request  11/11/2022  LOV: 07/18/2022  What is the name of the medication or equipment? amphetamine-dextroamphetamine amphetamine-dextroamphetamine (ADDERALL) 10 MG tablet  Have you contacted your pharmacy to request a refill? No  Pt called to say she has only 2 pills left.  Which pharmacy would you like this sent to?   [id number] [place], [place] -  [address] AT [facility] OF [address] [place] [address] Phone: [phone] Fax: [phone]    Patient notified that their request is being sent to the clinical staff for review and that they should receive a response within 2 business days.   Please advise at Mobile [phone] (mobile)

## 2022-11-12 ENCOUNTER — Other Ambulatory Visit: Payer: Self-pay | Admitting: Family Medicine

## 2022-11-12 DIAGNOSIS — F909 Attention-deficit hyperactivity disorder, unspecified type: Secondary | ICD-10-CM

## 2022-11-15 NOTE — Telephone Encounter (Signed)
Pt is aware md out of office today 

## 2022-11-16 NOTE — Telephone Encounter (Signed)
Patient called, patient was upset that her medication had not been sent for a refill, I tied to calm the patient down and speak to the patient, Dr. Salomon Fick denied request for refill because patient has a refill at the pharmacy, patient asked for me to check and make should the the refill was at the [facility], I Check and called the patient back and left a voicemail per the patient request

## 2022-11-18 NOTE — Telephone Encounter (Signed)
Pt has 2 refills at pharmacy.

## 2022-12-13 ENCOUNTER — Telehealth: Payer: Self-pay | Admitting: Family Medicine

## 2022-12-13 NOTE — Telephone Encounter (Signed)
Called pharmacy there is one refill ready. Called patient she is aware, patient also sch next medication refill visit

## 2022-12-13 NOTE — Telephone Encounter (Signed)
Prescription Request  12/13/2022  LOV: 07/18/2022  What is the name of the medication or equipment?  amphetamine-dextroamphetamine amphetamine-dextroamphetamine (ADDERALL) 10 MG tablet  Have you contacted your pharmacy to request a refill? No   Pt is aware MD is OOO on Tuesdays.  Pt states this Rx needs to be filled by Friday.   Which pharmacy would you like this sent to?   [id number] - [place], [place] - [address] AT [facility] [address] Phone: [phone]  Fax: [phone]       Patient notified that their request is being sent to the clinical staff for review and that they should receive a response within 2 business days.   Please advise at Mobile [phone] (mobile)

## 2023-01-05 ENCOUNTER — Encounter: Payer: Self-pay | Admitting: Family Medicine

## 2023-01-05 ENCOUNTER — Telehealth: Payer: 59 | Admitting: Family Medicine

## 2023-01-05 DIAGNOSIS — H9201 Otalgia, right ear: Secondary | ICD-10-CM | POA: Diagnosis not present

## 2023-01-05 DIAGNOSIS — J069 Acute upper respiratory infection, unspecified: Secondary | ICD-10-CM | POA: Diagnosis not present

## 2023-01-05 DIAGNOSIS — F909 Attention-deficit hyperactivity disorder, unspecified type: Secondary | ICD-10-CM | POA: Diagnosis not present

## 2023-01-05 MED ORDER — AMPHETAMINE-DEXTROAMPHETAMINE 10 MG PO TABS
10.0000 mg | ORAL_TABLET | Freq: Two times a day (BID) | ORAL | 0 refills | Status: DC
Start: 2023-01-05 — End: 2023-03-09

## 2023-01-05 MED ORDER — BENZONATATE 100 MG PO CAPS
100.0000 mg | ORAL_CAPSULE | Freq: Two times a day (BID) | ORAL | 0 refills | Status: DC | PRN
Start: 2023-01-05 — End: 2023-03-09

## 2023-01-05 NOTE — Progress Notes (Signed)
"  Patient was unable to self-report due to a lack of equipment at home via telehealth"

## 2023-01-05 NOTE — Progress Notes (Signed)
Virtual Visit via Video Note  I connected with Latoya Martinez on 01/05/23 at  1:00 PM EST by a video enabled telemedicine application and verified that I am speaking with the correct person using two identifiers.  Location patient: home, [place], [place] Location provider:work Persons participating in the virtual visit: patient, provider  I discussed the limitations of evaluation and management by telemedicine and the availability of in person appointments. The patient expressed understanding and agreed to proceed.  Chief Complaint  Patient presents with   Medication Refill    Ear pressure     HPI: Pt is a 57 yo female seen for med refill and acute concern.  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  R ear hurting.  Becoming worse each day.  Has a cough that is deep.  Mild rhinorrhea, but normal for her allergies. Takes flonase BID, OTC allergy med, and singlair.   Mildly sweaty/hot last night. No HAs.  Pt has not taken anything for the ear.    Pt doing well on Adderall 10 mg.  Requesting refills.    ROS: See pertinent positives and negatives per HPI.  Past Medical History:  Diagnosis Date   Allergy    Heart murmur     Past Surgical History:  Procedure Laterality Date   ABDOMINAL HYSTERECTOMY     AUGMENTATION MAMMAPLASTY Bilateral     Family History  Problem Relation Age of Onset   Arthritis Mother    Cancer Mother    Hearing loss Mother    Cancer Father    Depression Sister    Asthma Brother     Current Outpatient Medications:    albuterol (VENTOLIN HFA) 108 (90 Base) MCG/ACT inhaler, Inhale 2 puffs into the lungs every 6 (six) hours as needed for wheezing or shortness of breath., Disp: 6.7 g, Rfl: 5   amphetamine-dextroamphetamine (ADDERALL) 10 MG tablet, Take 1 tablet (10 mg total) by mouth 2 (two) times daily., Disp: 60 tablet, Rfl: 0   amphetamine-dextroamphetamine (ADDERALL) 10 MG tablet, Take 1 tablet (10 mg total) by mouth 2 (two) times daily.,  Disp: 60 tablet, Rfl: 0   amphetamine-dextroamphetamine (ADDERALL) 10 MG tablet, Take 1 tablet (10 mg total) by mouth 2 (two) times daily., Disp: 60 tablet, Rfl: 0   Azelastine HCl 137 MCG/SPRAY SOLN, SPRAY 1 TIME INTO EACH NOSTRIL TWICE A DAY, Disp: 30 mL, Rfl: 2   fluticasone (FLONASE) 50 MCG/ACT nasal spray, TAKE 2 SPRAYS IN EACH NOSTRIL EVERY DAY AT NIGHT, Disp: 16 mL, Rfl: 5   hydrOXYzine (ATARAX) 25 MG tablet, TAKE 1 TABLET BY MOUTH 3 TIMES A DAY AS NEEDED FOR ANXIETY, Disp: 270 tablet, Rfl: 0   montelukast (SINGULAIR) 10 MG tablet, Take 1 tablet (10 mg total) by mouth at bedtime., Disp: 90 tablet, Rfl: 3  EXAM:  VITALS per patient if applicable: RR 12-20 bpm  GENERAL: alert, oriented, appears well and in no acute distress  HEENT: atraumatic, conjunctiva clear, no obvious abnormalities on inspection of external nose and ears  NECK: normal movements of the head and neck  LUNGS: on inspection no signs of respiratory distress, breathing rate appears normal, no obvious gross SOB, gasping or wheezing  CV: no obvious cyanosis  MS: moves all visible extremities without noticeable abnormality  PSYCH/NEURO: pleasant and cooperative, no obvious depression or anxiety, speech and thought processing grossly intact  ASSESSMENT AND PLAN:  Discussed the following assessment and plan:  Viral URI with cough -continue supportive OTC cough/cold medications. -  Continue Flonase.  Advised to consider alternating with saline nasal rinse to avoid possible nosebleeds. -Given strict precautions -Patient to notify clinic for worsening symptoms over the weekend due to history of frequent frequent sinusitis  - Plan: benzonatate (TESSALON) 100 MG capsule  Attention deficit hyperactivity disorder (ADHD), unspecified ADHD type -Stable -PDMP reviewed and appropriate  - Plan: amphetamine-dextroamphetamine (ADDERALL) 10 MG tablet, amphetamine-dextroamphetamine (ADDERALL) 10 MG tablet,  amphetamine-dextroamphetamine (ADDERALL) 10 MG tablet  Acute otalgia, right -Discussed possible causes including eustachian tube dysfunction, AOM -OTC supportive care including Tylenol, decongestant, antihistamine -Notify clinic for continued or worsening symptoms    I discussed the assessment and treatment plan with the patient. The patient was provided an opportunity to ask questions and all were answered. The patient agreed with the plan and demonstrated an understanding of the instructions.   The patient was advised to call back or seek an in-person evaluation if the symptoms worsen or if the condition fails to improve as anticipated.   [name], MD

## 2023-03-09 ENCOUNTER — Telehealth (INDEPENDENT_AMBULATORY_CARE_PROVIDER_SITE_OTHER): Payer: 59 | Admitting: Family Medicine

## 2023-03-09 ENCOUNTER — Encounter: Payer: Self-pay | Admitting: Family Medicine

## 2023-03-09 DIAGNOSIS — F909 Attention-deficit hyperactivity disorder, unspecified type: Secondary | ICD-10-CM

## 2023-03-09 DIAGNOSIS — Z8709 Personal history of other diseases of the respiratory system: Secondary | ICD-10-CM

## 2023-03-09 DIAGNOSIS — J3089 Other allergic rhinitis: Secondary | ICD-10-CM

## 2023-03-09 MED ORDER — AMPHETAMINE-DEXTROAMPHETAMINE 10 MG PO TABS: 10.0000 mg | ORAL_TABLET | Freq: Two times a day (BID) | ORAL | 0 refills | Status: DC

## 2023-03-09 MED ORDER — ALBUTEROL SULFATE HFA 108 (90 BASE) MCG/ACT IN AERS: 2.0000 | INHALATION_SPRAY | Freq: Four times a day (QID) | RESPIRATORY_TRACT | 5 refills | Status: DC | PRN

## 2023-03-09 MED ORDER — FLUTICASONE PROPIONATE 50 MCG/ACT NA SUSP
NASAL | 5 refills | Status: DC
Start: 2023-03-09 — End: 2023-05-29

## 2023-03-09 MED ORDER — AZELASTINE HCL 137 MCG/SPRAY NA SOLN
NASAL | 2 refills | Status: AC
Start: 2023-03-09 — End: ?

## 2023-03-09 NOTE — Progress Notes (Signed)
"  Patient was unable to self-report due to a lack of equipment at home via telehealth"

## 2023-03-09 NOTE — Progress Notes (Signed)
Virtual Visit via Video Note  I connected with Latoya Martinez on 03/09/23 at  1:30 PM EST by a video enabled telemedicine application and verified that I am speaking with the correct person using two identifiers.  Location patient: home Location provider:work or home office Persons participating in the virtual visit: patient, provider  I discussed the limitations of evaluation and management by telemedicine and the availability of in person appointments. The patient expressed understanding and agreed to proceed. Chief Complaint  Patient presents with   Medication Refill   HPI: Pt is a 58 yo female with pmh sig for seasonal allergies, ADHD who was seen for f/u.  Pt states she is doing well.  She will be starting a new job in the next few wks.  Pt is excited.  Doing well on Adderall 10 mg BID.  No issues with sleep, wt loss, or changes in appetite.  Patient requesting refills on albuterol inhaler and nasal spray for allergy symptoms.  No recent sinus issues despite change in weather.  Patient states they got 4 inches of snow in [place], [place] 2 days ago.   ROS: See pertinent positives and negatives per HPI.  Past Medical History:  Diagnosis Date   Allergy    Heart murmur     Past Surgical History:  Procedure Laterality Date   ABDOMINAL HYSTERECTOMY     AUGMENTATION MAMMAPLASTY Bilateral     Family History  Problem Relation Age of Onset   Arthritis Mother    Cancer Mother    Hearing loss Mother    Cancer Father    Depression Sister    Asthma Brother     Current Outpatient Medications:    albuterol (VENTOLIN HFA) 108 (90 Base) MCG/ACT inhaler, Inhale 2 puffs into the lungs every 6 (six) hours as needed for wheezing or shortness of breath., Disp: 6.7 g, Rfl: 5   amphetamine-dextroamphetamine (ADDERALL) 10 MG tablet, Take 1 tablet (10 mg total) by mouth 2 (two) times daily., Disp: 60 tablet, Rfl: 0   amphetamine-dextroamphetamine (ADDERALL) 10 MG tablet, Take 1 tablet (10 mg  total) by mouth 2 (two) times daily., Disp: 60 tablet, Rfl: 0   amphetamine-dextroamphetamine (ADDERALL) 10 MG tablet, Take 1 tablet (10 mg total) by mouth 2 (two) times daily., Disp: 60 tablet, Rfl: 0   Azelastine HCl 137 MCG/SPRAY SOLN, SPRAY 1 TIME INTO EACH NOSTRIL TWICE A DAY, Disp: 30 mL, Rfl: 2   fluticasone (FLONASE) 50 MCG/ACT nasal spray, TAKE 2 SPRAYS IN EACH NOSTRIL EVERY DAY AT NIGHT, Disp: 16 mL, Rfl: 5   hydrOXYzine (ATARAX) 25 MG tablet, TAKE 1 TABLET BY MOUTH 3 TIMES A DAY AS NEEDED FOR ANXIETY, Disp: 270 tablet, Rfl: 0   montelukast (SINGULAIR) 10 MG tablet, Take 1 tablet (10 mg total) by mouth at bedtime., Disp: 90 tablet, Rfl: 3  EXAM:  VITALS per patient if applicable:  RR between 12-20 bpm.  GENERAL: alert, oriented, appears well and in no acute distress  HEENT: atraumatic, conjunctiva clear, no obvious abnormalities on inspection of external nose and ears  NECK: normal movements of the head and neck  LUNGS: on inspection no signs of respiratory distress, breathing rate appears normal, no obvious gross SOB, gasping or wheezing  CV: no obvious cyanosis  MS: moves all visible extremities without noticeable abnormality  PSYCH/NEURO: pleasant and cooperative, no obvious depression or anxiety, speech and thought processing grossly intact  ASSESSMENT AND PLAN:  Discussed the following assessment and plan:  Attention deficit hyperactivity disorder (ADHD),  unspecified ADHD type  -PMDP reviewed and appropriate -continue current dose Adderall 10 mg BID - Plan: amphetamine-dextroamphetamine (ADDERALL) 10 MG tablet, amphetamine-dextroamphetamine (ADDERALL) 10 MG tablet, amphetamine-dextroamphetamine (ADDERALL) 10 MG tablet  Environmental and seasonal allergies  - Plan: Azelastine HCl 137 MCG/SPRAY SOLN, fluticasone (FLONASE) 50 MCG/ACT nasal spray  History of COPD - Plan: albuterol (VENTOLIN HFA) 108 (90 Base) MCG/ACT inhaler  Follow-up in 3 months for refills,  sooner if needed  I discussed the assessment and treatment plan with the patient. The patient was provided an opportunity to ask questions and all were answered. The patient agreed with the plan and demonstrated an understanding of the instructions.   The patient was advised to call back or seek an in-person evaluation if the symptoms worsen or if the condition fails to improve as anticipated.   [name], MD

## 2023-05-04 ENCOUNTER — Ambulatory Visit: Payer: Self-pay | Admitting: Family Medicine

## 2023-05-29 ENCOUNTER — Encounter: Payer: Self-pay | Admitting: Family Medicine

## 2023-05-29 ENCOUNTER — Telehealth (INDEPENDENT_AMBULATORY_CARE_PROVIDER_SITE_OTHER): Payer: Self-pay | Admitting: Family Medicine

## 2023-05-29 ENCOUNTER — Telehealth: Payer: Self-pay | Admitting: Family Medicine

## 2023-05-29 DIAGNOSIS — J3089 Other allergic rhinitis: Secondary | ICD-10-CM

## 2023-05-29 DIAGNOSIS — F909 Attention-deficit hyperactivity disorder, unspecified type: Secondary | ICD-10-CM

## 2023-05-29 DIAGNOSIS — Z8709 Personal history of other diseases of the respiratory system: Secondary | ICD-10-CM | POA: Diagnosis not present

## 2023-05-29 MED ORDER — AMPHETAMINE-DEXTROAMPHETAMINE 10 MG PO TABS: 10.0000 mg | ORAL_TABLET | Freq: Two times a day (BID) | ORAL | 0 refills | Status: DC

## 2023-05-29 MED ORDER — FLUTICASONE PROPIONATE 50 MCG/ACT NA SUSP: NASAL | 6 refills | Status: DC

## 2023-05-29 MED ORDER — ALBUTEROL SULFATE HFA 108 (90 BASE) MCG/ACT IN AERS: 2.0000 | INHALATION_SPRAY | Freq: Four times a day (QID) | RESPIRATORY_TRACT | 5 refills | Status: DC | PRN

## 2023-05-29 NOTE — Progress Notes (Signed)
 Virtual Visit via Video Note  I connected with Latoya Martinez on 05/29/23 at  4:30 PM EDT by a video enabled telemedicine application and verified that I am speaking with the correct person using two identifiers.  Location patient: home Location provider:work or home office Persons participating in the virtual visit: patient, provider  I discussed the limitations of evaluation and management by telemedicine and the availability of in person appointments. The patient expressed understanding and agreed to proceed.  Chief Complaint  Patient presents with   Medication Refill    HPI: Pt is a 58 yo female seen for f/u on ADHD, allergies, and for med refill.  Pt doing well on Adderall 10 mg BID.  Sleep is good, mood is good, weight stable.  Still enjoying her new job.  Less stressful.  Doing some travel for work.  Patient states allergies are currently well-managed.  Requesting refill on albuterol inhaler just in case, Flonase.  Not currently having to take Singulair.  ROS: See pertinent positives and negatives per HPI.  Past Medical History:  Diagnosis Date   Allergy    Heart murmur     Past Surgical History:  Procedure Laterality Date   ABDOMINAL HYSTERECTOMY     AUGMENTATION MAMMAPLASTY Bilateral     Family History  Problem Relation Age of Onset   Arthritis Mother    Cancer Mother    Hearing loss Mother    Cancer Father    Depression Sister    Asthma Brother     Current Outpatient Medications:    albuterol (VENTOLIN HFA) 108 (90 Base) MCG/ACT inhaler, Inhale 2 puffs into the lungs every 6 (six) hours as needed for wheezing or shortness of breath., Disp: 6.7 g, Rfl: 5   amphetamine-dextroamphetamine (ADDERALL) 10 MG tablet, Take 1 tablet (10 mg total) by mouth 2 (two) times daily., Disp: 60 tablet, Rfl: 0   amphetamine-dextroamphetamine (ADDERALL) 10 MG tablet, Take 1 tablet (10 mg total) by mouth 2 (two) times daily., Disp: 60 tablet, Rfl: 0   amphetamine-dextroamphetamine  (ADDERALL) 10 MG tablet, Take 1 tablet (10 mg total) by mouth 2 (two) times daily., Disp: 60 tablet, Rfl: 0   Azelastine HCl 137 MCG/SPRAY SOLN, SPRAY 1 TIME INTO EACH NOSTRIL TWICE A DAY, Disp: 30 mL, Rfl: 2   fluticasone (FLONASE) 50 MCG/ACT nasal spray, TAKE 2 SPRAYS IN EACH NOSTRIL EVERY DAY AT NIGHT, Disp: 16 mL, Rfl: 5   hydrOXYzine (ATARAX) 25 MG tablet, TAKE 1 TABLET BY MOUTH 3 TIMES A DAY AS NEEDED FOR ANXIETY, Disp: 270 tablet, Rfl: 0   montelukast (SINGULAIR) 10 MG tablet, Take 1 tablet (10 mg total) by mouth at bedtime., Disp: 90 tablet, Rfl: 3  EXAM:  VITALS per patient if applicable:  RR 12-20 bpm  GENERAL: alert, oriented, appears well and in no acute distress  HEENT: atraumatic, conjunctiva clear, no obvious abnormalities on inspection of external nose and ears  NECK: normal movements of the head and neck  LUNGS: on inspection no signs of respiratory distress, breathing rate appears normal, no obvious gross SOB, gasping or wheezing  CV: no obvious cyanosis  MS: moves all visible extremities without noticeable abnormality  PSYCH/NEURO: pleasant and cooperative, no obvious depression or anxiety, speech and thought processing grossly intact     05/29/2023    4:16 PM 03/09/2023    1:26 PM 01/05/2023   12:02 PM  Depression screen PHQ 2/9  Decreased Interest 0 0 0  Down, Depressed, Hopeless 0 0 0  PHQ -  2 Score 0 0 0  Altered sleeping 0  0  Tired, decreased energy 0  0  Change in appetite 0  0  Feeling bad or failure about yourself  0  0  Trouble concentrating 0  0  Moving slowly or fidgety/restless 0  0  Suicidal thoughts 0  0  PHQ-9 Score 0  0  Difficult doing work/chores   Not difficult at all      05/29/2023    4:17 PM 01/05/2023   12:02 PM 10/07/2022   10:49 AM 07/18/2022    1:12 PM  GAD 7 : Generalized Anxiety Score  Nervous, Anxious, on Edge 0 0 0 1  Control/stop worrying 0 0 0 0  Worry too much - different things 0 0 0 0  Trouble relaxing 0 0 0 0   Restless 0 0 0 0  Easily annoyed or irritable 0 0 0 0  Afraid - awful might happen 0 0 0 0  Total GAD 7 Score 0 0 0 1  Anxiety Difficulty  Not difficult at all Not difficult at all Somewhat difficult    ASSESSMENT AND PLAN:  Discussed the following assessment and plan:  Attention deficit hyperactivity disorder (ADHD), unspecified ADHD type - Plan: amphetamine-dextroamphetamine (ADDERALL) 10 MG tablet, amphetamine-dextroamphetamine (ADDERALL) 10 MG tablet, amphetamine-dextroamphetamine (ADDERALL) 10 MG tablet  Environmental and seasonal allergies - Plan: fluticasone (FLONASE) 50 MCG/ACT nasal spray  History of COPD - Plan: albuterol (VENTOLIN HFA) 108 (90 Base) MCG/ACT inhaler  Patient seen for follow-up and med refills.  PMDP reviewed and appropriate. Refills for Adderall 10 mg twice daily x 3 months sent to pharmacy.  Flonase and albuterol inhaler refilled.  Patient has Singulair in case needed.  Follow-up in 3 months for additional refills.   I discussed the assessment and treatment plan with the patient. The patient was provided an opportunity to ask questions and all were answered. The patient agreed with the plan and demonstrated an understanding of the instructions.   The patient was advised to call back or seek an in-person evaluation if the symptoms worsen or if the condition fails to improve as anticipated.   Viola Greulich, MD

## 2023-05-29 NOTE — Progress Notes (Signed)
 Patient was unable to self-report due to a lack of equipment at home via telehealth

## 2023-08-11 ENCOUNTER — Other Ambulatory Visit (HOSPITAL_COMMUNITY): Payer: Self-pay

## 2023-09-04 ENCOUNTER — Other Ambulatory Visit (HOSPITAL_COMMUNITY): Payer: Self-pay

## 2023-09-04 ENCOUNTER — Telehealth: Admitting: Family Medicine

## 2023-09-04 ENCOUNTER — Telehealth: Payer: Self-pay

## 2023-09-04 ENCOUNTER — Encounter: Payer: Self-pay | Admitting: Family Medicine

## 2023-09-04 VITALS — Ht 66.0 in | Wt 121.0 lb

## 2023-09-04 DIAGNOSIS — Z8709 Personal history of other diseases of the respiratory system: Secondary | ICD-10-CM

## 2023-09-04 DIAGNOSIS — J3089 Other allergic rhinitis: Secondary | ICD-10-CM

## 2023-09-04 DIAGNOSIS — F909 Attention-deficit hyperactivity disorder, unspecified type: Secondary | ICD-10-CM | POA: Diagnosis not present

## 2023-09-04 DIAGNOSIS — L700 Acne vulgaris: Secondary | ICD-10-CM | POA: Diagnosis not present

## 2023-09-04 MED ORDER — AMPHETAMINE-DEXTROAMPHETAMINE 10 MG PO TABS: 10.0000 mg | ORAL_TABLET | Freq: Two times a day (BID) | ORAL | 0 refills | Status: DC

## 2023-09-04 NOTE — Telephone Encounter (Signed)
 Pharmacy Patient Advocate Encounter   Received notification from CoverMyMeds that prior authorization for Amphetamine -dextroamphetamine  10mg  tabs is required/requested.   Insurance verification completed.   The patient is insured through [facility] .   Per test claim: PA required; PA submitted to above mentioned insurance via CoverMyMeds Key/confirmation #/EOC [id number] Status is pending   Phone# [phone] Fax# [phone]

## 2023-09-04 NOTE — Progress Notes (Signed)
 Virtual Visit via Video Note  I connected with Latoya Martinez on 09/04/23 at  4:30 PM EDT by a video enabled telemedicine application and verified that I am speaking with the correct person using two identifiers.  Location patient: home Location provider:work or home office Persons participating in the virtual visit: patient, provider  I discussed the limitations of evaluation and management by telemedicine and the availability of in person appointments. The patient expressed understanding and agreed to proceed. Chief Complaint  Patient presents with   Medical Management of Chronic Issues    3 months f/u    HPI:  Pt is a 58 yo female seen for f/u and med refill.  Pt states she is doing well.  Since being in [place] more for work having less sinus and allergy symptoms.    Requesting refill on Adderall 10 mg BID.  Copay seems lower with new insurance, but received a letter in the mail stating they don't cover.  No longer having to take Hydroxyzine  since getting a new job.  Not longer needing singulair .  Taking OTC alervert or other antihistamine.  Switches allergy med q 3 months or so.  Starting to get more cystic acne on face.  Getting 3-4 in the last few months.  Not wearing as much makeup.    ROS: See pertinent positives and negatives per HPI.  Past Medical History:  Diagnosis Date   Allergy    Heart murmur     Past Surgical History:  Procedure Laterality Date   ABDOMINAL HYSTERECTOMY     AUGMENTATION MAMMAPLASTY Bilateral     Family History  Problem Relation Age of Onset   Arthritis Mother    Cancer Mother    Hearing loss Mother    Cancer Father    Depression Sister    Asthma Brother     Current Outpatient Medications:    albuterol  (VENTOLIN  HFA) 108 (90 Base) MCG/ACT inhaler, Inhale 2 puffs into the lungs every 6 (six) hours as needed for wheezing or shortness of breath., Disp: 6.7 g, Rfl: 5   Azelastine  HCl 137 MCG/SPRAY SOLN, SPRAY 1 TIME INTO EACH NOSTRIL  TWICE A DAY, Disp: 30 mL, Rfl: 2   fluticasone  (FLONASE ) 50 MCG/ACT nasal spray, TAKE 2 SPRAYS IN EACH NOSTRIL EVERY DAY AT NIGHT, Disp: 16 mL, Rfl: 6   hydrOXYzine  (ATARAX ) 25 MG tablet, TAKE 1 TABLET BY MOUTH 3 TIMES A DAY AS NEEDED FOR ANXIETY, Disp: 270 tablet, Rfl: 0   montelukast  (SINGULAIR ) 10 MG tablet, Take 1 tablet (10 mg total) by mouth at bedtime., Disp: 90 tablet, Rfl: 3   amphetamine -dextroamphetamine  (ADDERALL) 10 MG tablet, Take 1 tablet (10 mg total) by mouth 2 (two) times daily., Disp: 60 tablet, Rfl: 0   amphetamine -dextroamphetamine  (ADDERALL) 10 MG tablet, Take 1 tablet (10 mg total) by mouth 2 (two) times daily., Disp: 60 tablet, Rfl: 0   amphetamine -dextroamphetamine  (ADDERALL) 10 MG tablet, Take 1 tablet (10 mg total) by mouth 2 (two) times daily., Disp: 60 tablet, Rfl: 0  EXAM:  VITALS per patient if applicable:  RR between 12-20 bpm  GENERAL: alert, oriented, appears well and in no acute distress  HEENT: atraumatic, conjunctiva clear, no obvious abnormalities on inspection of external nose and ears  NECK: normal movements of the head and neck  LUNGS: on inspection no signs of respiratory distress, breathing rate appears normal, no obvious gross SOB, gasping or wheezing  CV: no obvious cyanosis  MS: moves all visible extremities without noticeable abnormality  PSYCH/NEURO:  pleasant and cooperative, no obvious depression or anxiety, speech and thought processing grossly intact  ASSESSMENT AND PLAN:  Discussed the following assessment and plan:  Attention deficit hyperactivity disorder (ADHD), unspecified ADHD type - Plan: amphetamine -dextroamphetamine  (ADDERALL) 10 MG tablet, amphetamine -dextroamphetamine  (ADDERALL) 10 MG tablet, amphetamine -dextroamphetamine  (ADDERALL) 10 MG tablet  Environmental and seasonal allergies  History of COPD  Cystic acne  HD stable on Adderall 10 mg twice daily.  Refill sent to pharmacy.  Patient to check with insurance  company regarding possible coverage for med/need for prior authorization.  PDMP reviewed and appropriate.  Continue OTC antihistamines for seasonal allergies as well as Flonase .  Using azelastine  for severe symptoms.  Albuterol  inhaler as needed.  Discussed various OTC facial washes and products for new cystic acne.  Continue follow-up every 3 months for ADHD/med refill.  Follow-up sooner/as needed for other concerns.   I discussed the assessment and treatment plan with the patient. The patient was provided an opportunity to ask questions and all were answered. The patient agreed with the plan and demonstrated an understanding of the instructions.   The patient was advised to call back or seek an in-person evaluation if the symptoms worsen or if the condition fails to improve as anticipated.   [name], MD

## 2023-09-06 NOTE — Telephone Encounter (Signed)
 Pharmacy Patient Advocate Encounter  Received notification from [facility] that Prior Authorization for Amphetamine -dextroamphetamine  10mg  tabs  has been APPROVED from 09/04/23 to 09/02/24   PA #/Case ID/Reference #:  [id number]

## 2023-12-04 ENCOUNTER — Telehealth (INDEPENDENT_AMBULATORY_CARE_PROVIDER_SITE_OTHER): Admitting: Family Medicine

## 2023-12-04 DIAGNOSIS — F909 Attention-deficit hyperactivity disorder, unspecified type: Secondary | ICD-10-CM

## 2023-12-04 DIAGNOSIS — J3089 Other allergic rhinitis: Secondary | ICD-10-CM

## 2023-12-04 DIAGNOSIS — Z8709 Personal history of other diseases of the respiratory system: Secondary | ICD-10-CM | POA: Diagnosis not present

## 2023-12-04 MED ORDER — AMPHETAMINE-DEXTROAMPHETAMINE 10 MG PO TABS: 10.0000 mg | ORAL_TABLET | Freq: Two times a day (BID) | ORAL | 0 refills | Status: DC

## 2023-12-04 MED ORDER — FLUTICASONE PROPIONATE 50 MCG/ACT NA SUSP: NASAL | 6 refills | Status: DC

## 2023-12-04 MED ORDER — ALBUTEROL SULFATE HFA 108 (90 BASE) MCG/ACT IN AERS: 2.0000 | INHALATION_SPRAY | Freq: Four times a day (QID) | RESPIRATORY_TRACT | 5 refills | Status: DC | PRN

## 2023-12-04 NOTE — Progress Notes (Signed)
 Patient was unable to self-report due to a lack of equipment at home via telehealth

## 2023-12-04 NOTE — Progress Notes (Signed)
 Virtual Visit via Video Note  I connected with Latoya Martinez on 12/04/23 at  4:00 PM EDT by a video enabled telemedicine application and verified that I am speaking with the correct person using two identifiers.  Location patient: home Location provider:work or home office Persons participating in the virtual visit: patient, provider  I discussed the limitations of evaluation and management by telemedicine and the availability of in person appointments. The patient expressed understanding and agreed to proceed. Chief Complaint  Patient presents with   Medical Management of Chronic Issues    Medication      HPI: Pt is a 58 yo  female seen for f/u on ADHD.  States doing well on Adderall 10 mg twice daily.  Wt stable.  Denies issues with sleep.  Mood is good.    Requesting refill on flonase  and albuterol  inhaler.  Has a h/o COPD and allergies.  Changes in weather can cause symptoms that typically lead into a sinus infection.  ROS: See pertinent positives and negatives per HPI.  Past Medical History:  Diagnosis Date   Allergy    Heart murmur     Past Surgical History:  Procedure Laterality Date   ABDOMINAL HYSTERECTOMY     AUGMENTATION MAMMAPLASTY Bilateral     Family History  Problem Relation Age of Onset   Arthritis Mother    Cancer Mother    Hearing loss Mother    Cancer Father    Depression Sister    Asthma Brother      Current Outpatient Medications:    albuterol  (VENTOLIN  HFA) 108 (90 Base) MCG/ACT inhaler, Inhale 2 puffs into the lungs every 6 (six) hours as needed for wheezing or shortness of breath., Disp: 6.7 g, Rfl: 5   amphetamine -dextroamphetamine  (ADDERALL) 10 MG tablet, Take 1 tablet (10 mg total) by mouth 2 (two) times daily., Disp: 60 tablet, Rfl: 0   amphetamine -dextroamphetamine  (ADDERALL) 10 MG tablet, Take 1 tablet (10 mg total) by mouth 2 (two) times daily., Disp: 60 tablet, Rfl: 0   amphetamine -dextroamphetamine  (ADDERALL) 10 MG tablet, Take 1  tablet (10 mg total) by mouth 2 (two) times daily., Disp: 60 tablet, Rfl: 0   Azelastine  HCl 137 MCG/SPRAY SOLN, SPRAY 1 TIME INTO EACH NOSTRIL TWICE A DAY, Disp: 30 mL, Rfl: 2   fluticasone  (FLONASE ) 50 MCG/ACT nasal spray, TAKE 2 SPRAYS IN EACH NOSTRIL EVERY DAY AT NIGHT, Disp: 16 mL, Rfl: 6  EXAM:  VITALS per patient if applicable:  RR between 12-20 bpm  GENERAL: alert, oriented, appears well and in no acute distress  HEENT: atraumatic, conjunctiva clear, no obvious abnormalities on inspection of external nose and ears  NECK: normal movements of the head and neck  LUNGS: on inspection no signs of respiratory distress, breathing rate appears normal, no obvious gross SOB, gasping or wheezing  CV: no obvious cyanosis  MS: moves all visible extremities without noticeable abnormality  PSYCH/NEURO: pleasant and cooperative, no obvious depression or anxiety, speech and thought processing grossly intact  ASSESSMENT AND PLAN:  Discussed the following assessment and plan:  Attention deficit hyperactivity disorder (ADHD), unspecified ADHD type - Plan: amphetamine -dextroamphetamine  (ADDERALL) 10 MG tablet, amphetamine -dextroamphetamine  (ADDERALL) 10 MG tablet, amphetamine -dextroamphetamine  (ADDERALL) 10 MG tablet  History of COPD - Plan: albuterol  (VENTOLIN  HFA) 108 (90 Base) MCG/ACT inhaler  Environmental and seasonal allergies - Plan: fluticasone  (FLONASE ) 50 MCG/ACT nasal spray  PDMP reviewed and appropriate.  ADHD stable on Adderall 10 mg BID.  Refills x 3 months provided.  H/o COPD- stable.  Albuterol  inhaler prn refilled.   Allergies controlled with flonase  and otc meds prn.  F/u in 3 months, sooner if needed.   I discussed the assessment and treatment plan with the patient. The patient was provided an opportunity to ask questions and all were answered. The patient agreed with the plan and demonstrated an understanding of the instructions.   The patient was advised to call back  or seek an in-person evaluation if the symptoms worsen or if the condition fails to improve as anticipated.   [name], MD

## 2024-02-29 ENCOUNTER — Encounter: Payer: Self-pay | Admitting: Family Medicine

## 2024-02-29 ENCOUNTER — Telehealth: Payer: Self-pay | Admitting: Family Medicine

## 2024-02-29 DIAGNOSIS — Z8709 Personal history of other diseases of the respiratory system: Secondary | ICD-10-CM

## 2024-02-29 DIAGNOSIS — F909 Attention-deficit hyperactivity disorder, unspecified type: Secondary | ICD-10-CM

## 2024-02-29 DIAGNOSIS — J3089 Other allergic rhinitis: Secondary | ICD-10-CM | POA: Diagnosis not present

## 2024-02-29 MED ORDER — FLUTICASONE PROPIONATE 50 MCG/ACT NA SUSP: NASAL | 6 refills | Status: AC

## 2024-02-29 MED ORDER — AMPHETAMINE-DEXTROAMPHETAMINE 10 MG PO TABS: 10.0000 mg | ORAL_TABLET | Freq: Two times a day (BID) | ORAL | 0 refills | Status: AC

## 2024-02-29 MED ORDER — ALBUTEROL SULFATE HFA 108 (90 BASE) MCG/ACT IN AERS: 2.0000 | INHALATION_SPRAY | Freq: Four times a day (QID) | RESPIRATORY_TRACT | 5 refills | Status: AC | PRN

## 2024-02-29 NOTE — Progress Notes (Signed)
 Virtual Visit via Video Note  I connected with Latoya Martinez on 02/29/24 at  3:30 PM EST by a video enabled telemedicine application and verified that I am speaking with the correct person using two identifiers.  Location patient: home Location provider:work or home office Persons participating in the virtual visit: patient, provider  I discussed the limitations of evaluation and management by telemedicine and the availability of in person appointments. The patient expressed understanding and agreed to proceed.  Chief Complaint  Patient presents with   Medical Management of Chronic Issues    ADHD     HPI:  Pt is a 59 year old female seen for follow-up on chronic conditions and for refills.  Patient taking Adderall 10 mg twice daily for history of ADHD.  Currently stable on medication.  Denies changes in weight or sleep, or loss of appetite.  Patient requesting refill on Flonase  for history of allergies and albuterol  inhaler for COPD noted on imaging.  ROS: See pertinent positives and negatives per HPI.  Past Medical History:  Diagnosis Date   Allergy    Heart murmur     Past Surgical History:  Procedure Laterality Date   ABDOMINAL HYSTERECTOMY     AUGMENTATION MAMMAPLASTY Bilateral     Family History  Problem Relation Age of Onset   Arthritis Mother    Cancer Mother    Hearing loss Mother    Cancer Father    Depression Sister    Asthma Brother     Current Medications[1]  EXAM:  VITALS per patient if applicable: RR 2012-20 bpm.  GENERAL: alert, oriented, appears well and in no acute distress  HEENT: atraumatic, conjunctiva clear, no obvious abnormalities on inspection of external nose and ears  NECK: normal movements of the head and neck  LUNGS: on inspection no signs of respiratory distress, breathing rate appears normal, no obvious gross SOB, gasping or wheezing  CV: no obvious cyanosis  MS: moves all visible extremities without noticeable  abnormality  PSYCH/NEURO: pleasant and cooperative, no obvious depression or anxiety, speech and thought processing grossly intact  ASSESSMENT AND PLAN:  Discussed the following assessment and plan:  Attention deficit hyperactivity disorder (ADHD), unspecified ADHD type - Plan: amphetamine -dextroamphetamine  (ADDERALL) 10 MG tablet, amphetamine -dextroamphetamine  (ADDERALL) 10 MG tablet, amphetamine -dextroamphetamine  (ADDERALL) 10 MG tablet  History of COPD - Plan: albuterol  (VENTOLIN  HFA) 108 (90 Base) MCG/ACT inhaler  Environmental and seasonal allergies - Plan: fluticasone  (FLONASE ) 50 MCG/ACT nasal spray  ADHD stable on current medication regimen.  PDMP reviewed and appropriate.  Refill Adderall 10 mg twice daily x 3 months.  History of COPD noted on imaging.  No current issues with breathing.  Albuterol  refilled as needed.  Allergies controlled.  Continue Flonase  prn.  Follow-up in 3 months, sooner if needed.   I discussed the assessment and treatment plan with the patient. The patient was provided an opportunity to ask questions and all were answered. The patient agreed with the plan and demonstrated an understanding of the instructions.   The patient was advised to call back or seek an in-person evaluation if the symptoms worsen or if the condition fails to improve as anticipated.   Latoya JONELLE Single, MD      [1]  Current Outpatient Medications:    Azelastine  HCl 137 MCG/SPRAY SOLN, SPRAY 1 TIME INTO EACH NOSTRIL TWICE A DAY, Disp: 30 mL, Rfl: 2   albuterol  (VENTOLIN  HFA) 108 (90 Base) MCG/ACT inhaler, Inhale 2 puffs into the lungs every 6 (six) hours as needed for  wheezing or shortness of breath., Disp: 6.7 g, Rfl: 5   amphetamine -dextroamphetamine  (ADDERALL) 10 MG tablet, Take 1 tablet (10 mg total) by mouth 2 (two) times daily., Disp: 60 tablet, Rfl: 0   amphetamine -dextroamphetamine  (ADDERALL) 10 MG tablet, Take 1 tablet (10 mg total) by mouth 2 (two) times daily., Disp: 60  tablet, Rfl: 0   amphetamine -dextroamphetamine  (ADDERALL) 10 MG tablet, Take 1 tablet (10 mg total) by mouth 2 (two) times daily., Disp: 60 tablet, Rfl: 0   fluticasone  (FLONASE ) 50 MCG/ACT nasal spray, TAKE 2 SPRAYS IN EACH NOSTRIL EVERY DAY AT NIGHT, Disp: 16 mL, Rfl: 6

## 2024-03-05 ENCOUNTER — Telehealth: Payer: Self-pay

## 2024-03-05 NOTE — Telephone Encounter (Signed)
 Prior Auth needed for albuterol  sulfate HFA 108

## 2024-03-06 ENCOUNTER — Telehealth: Payer: Self-pay

## 2024-03-06 ENCOUNTER — Other Ambulatory Visit (HOSPITAL_COMMUNITY): Payer: Self-pay

## 2024-03-06 NOTE — Telephone Encounter (Signed)
 Pharmacy Patient Advocate Encounter   Received notification from [facility] that prior authorization for Ventolin  inhaler is required/requested.   Insurance verification completed.   The patient is insured through [facility].   Per test claim: PA required; PA submitted to above mentioned insurance via [employer]/confirmation #/EOC [id number] Status is pending

## 2024-03-11 ENCOUNTER — Other Ambulatory Visit (HOSPITAL_COMMUNITY): Payer: Self-pay

## 2024-03-13 NOTE — Telephone Encounter (Signed)
 Addressed in other encounter.
